# Patient Record
Sex: Male | Born: 1950 | Race: White | Hispanic: No | Marital: Married | Smoking: Former smoker
Health system: Southern US, Community
[De-identification: ages and names within clinical notes are randomized; demographics above are authoritative.]

## PROBLEM LIST (undated history)

## (undated) DIAGNOSIS — E119 Type 2 diabetes mellitus without complications: Secondary | ICD-10-CM

## (undated) HISTORY — PX: BACK SURGERY: SHX140

---

## 1999-11-20 ENCOUNTER — Encounter: Payer: Self-pay | Admitting: Emergency Medicine

## 1999-11-20 ENCOUNTER — Emergency Department (HOSPITAL_COMMUNITY): Admission: EM | Admit: 1999-11-20 | Discharge: 1999-11-20 | Payer: Self-pay | Admitting: Emergency Medicine

## 2007-02-21 ENCOUNTER — Encounter: Admission: RE | Admit: 2007-02-21 | Discharge: 2007-02-21 | Payer: Self-pay | Admitting: Neurosurgery

## 2007-03-22 ENCOUNTER — Inpatient Hospital Stay (HOSPITAL_COMMUNITY): Admission: RE | Admit: 2007-03-22 | Discharge: 2007-03-23 | Payer: Self-pay | Admitting: Neurosurgery

## 2008-11-02 IMAGING — RF DG MYELOGRAM CERVICAL
11 series · 11 of 11 positions shown · IV contrast (omnipaque)
Comparison: none

CLINICAL DATA: Patient has neck pain extending down the left arm and fingers with numbness.  He was referred for as cervical myelogram:
 CERVICAL MYELOGRAM:
 Lumbar puncture was performed at the L3 level.  10cc of Omnipaque 300 contrast was injected.  The needle was withdrawn and contrast was subsequently dumped into the cervical region. 
 There is an extradural defect on the left at the C6-7 level.  Truncation of the left C7 root sleeve.  In the lateral projection, ventral defects are seen at C3-4, C4-5.
TECHNIQUE: Multidetector CT imaging of the cervical spine was performed after intrathecal injection of contrast.  Multiplanar CT image reconstructions were also generated.  
 In the sagittal projection the alignment is anatomic.  Uncinate spurring is seen at C3-4 and C4-5.  No appreciable osseous foraminal encroachment.  
 Axial Imaging:  
 C2-3:  Mild uncinate spurring. 
 C3-4:  Uncinate spurring with mild facet degenerative change.  
 C4-5:  Uncinate spurring with disc bulge.  Facet degenerative change.  No appreciable central nor foraminal stenosis. 
 C5-6:  Uncinate spurring with facet hypertrophy.  Mild narrowing of the left C5-6 foramen. 
 C6-7:  Left foraminal disc extrusion.  Disc material results in truncation of the exiting nerve root.  There is questionable disc material also extending into the right foramen without definite encroachment on the nerve root.  Facet degenerative changes bilaterally.  No central stenosis. 
 C7-T1:  Normal interspace.

[Series 1: (hospital) · 1 of 1 slices shown (1 of 2)]
[im 1/1]
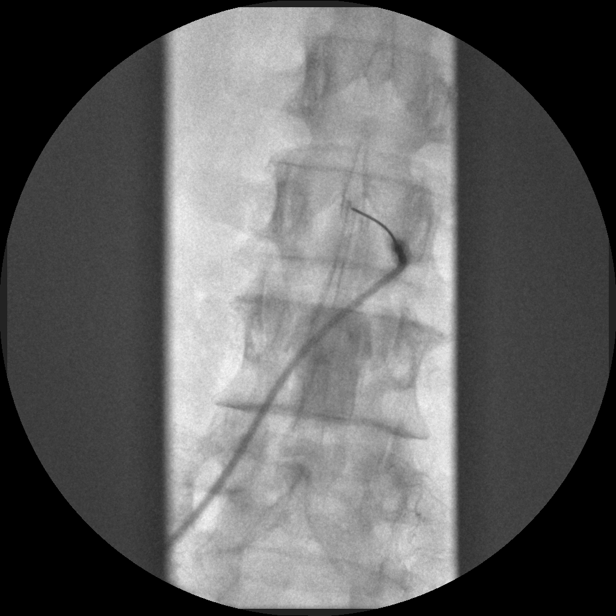

[Series 2: (hospital) · 1 of 1 slices shown (2 of 2)]
[im 1/1]
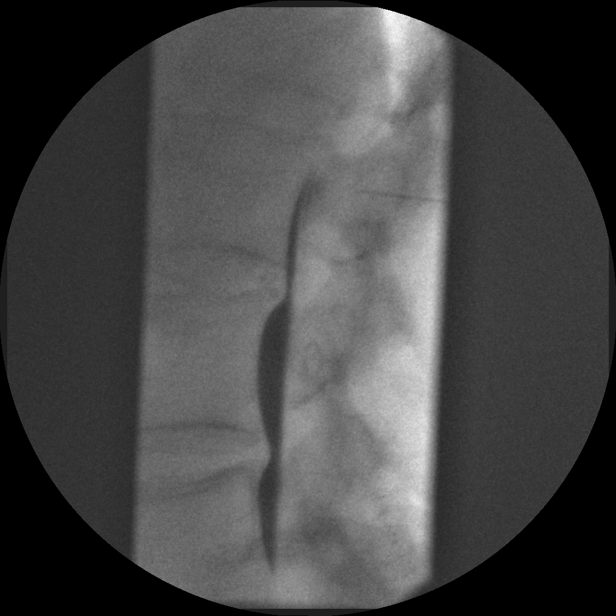

[Series 3: myelogram  white · 1 of 1 slices shown (1 of 9)]
[im 1/1]
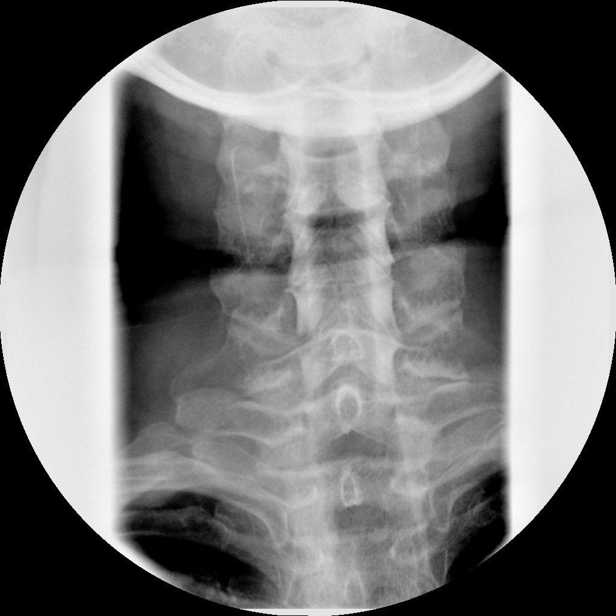

[Series 4: myelogram  white · 1 of 1 slices shown (2 of 9)]
[im 1/1]
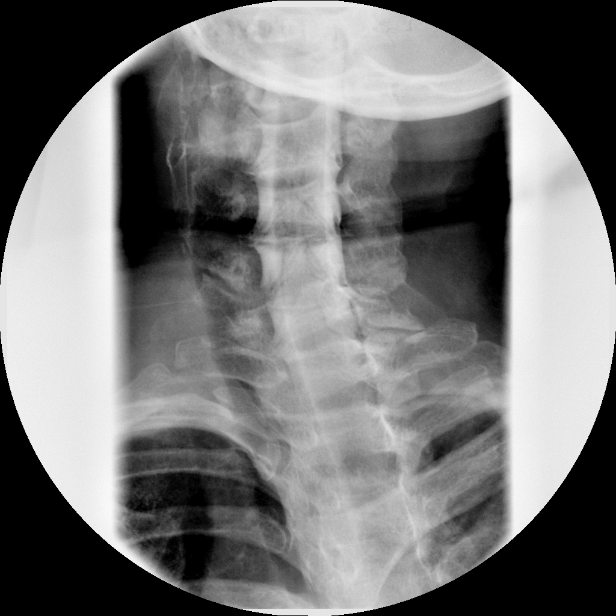

[Series 5: myelogram  white · 1 of 1 slices shown (3 of 9)]
[im 1/1]
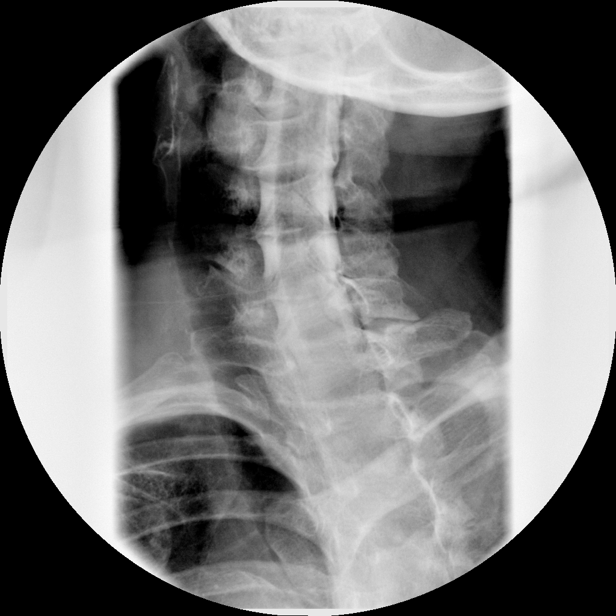

[Series 6: myelogram  white · 1 of 1 slices shown (4 of 9)]
[im 1/1]
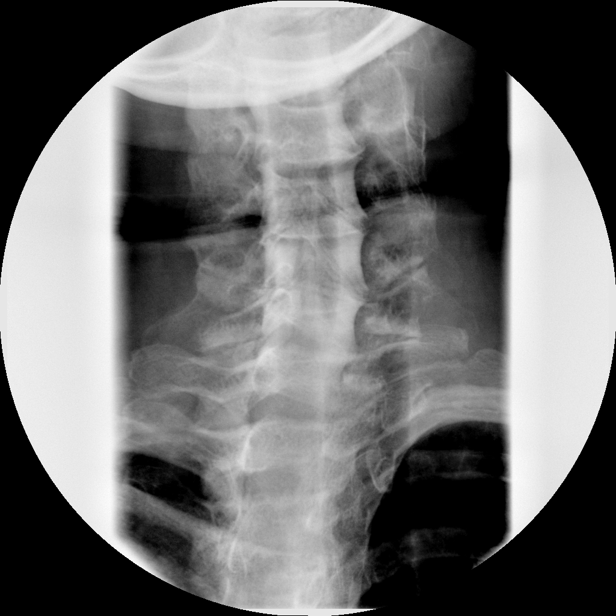

[Series 7: myelogram  white · 1 of 1 slices shown (5 of 9)]
[im 1/1]
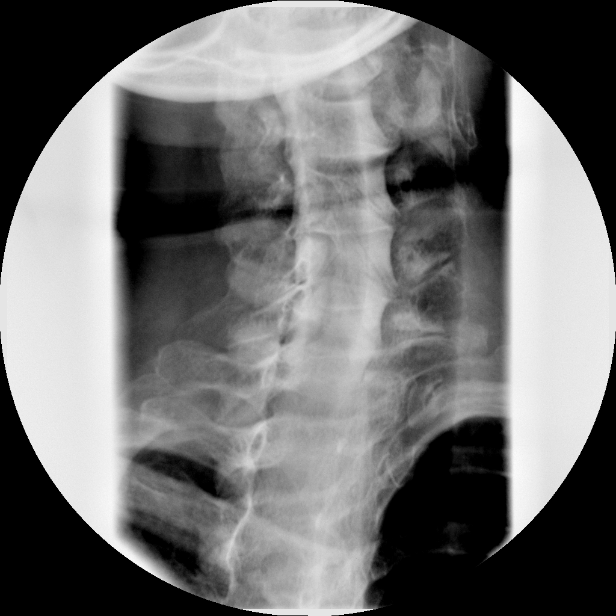

[Series 8: myelogram  white · 1 of 1 slices shown (6 of 9)]
[im 1/1]
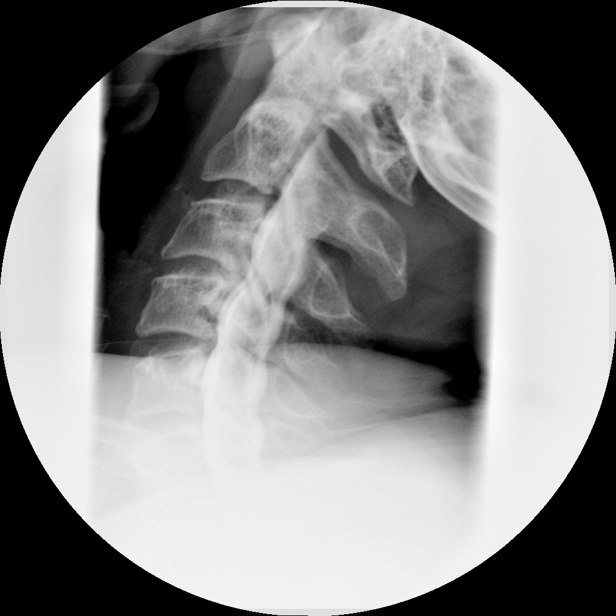

[Series 9: myelogram  white · 1 of 1 slices shown (7 of 9)]
[im 1/1]
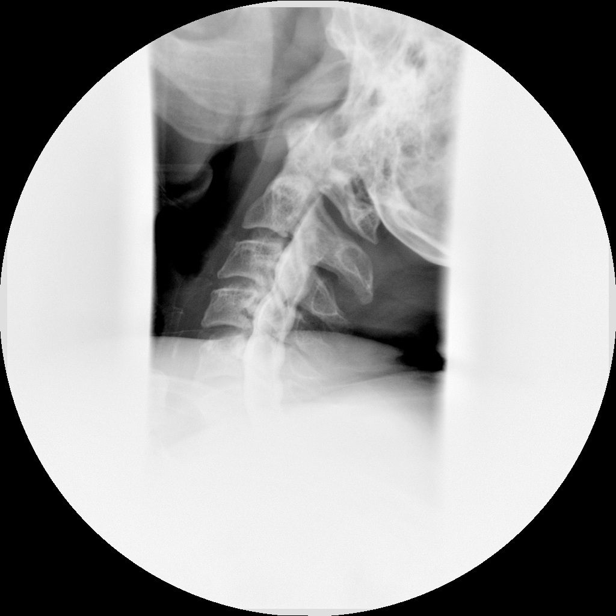

[Series 10: myelogram  white · 1 of 1 slices shown (8 of 9)]
[im 1/1]
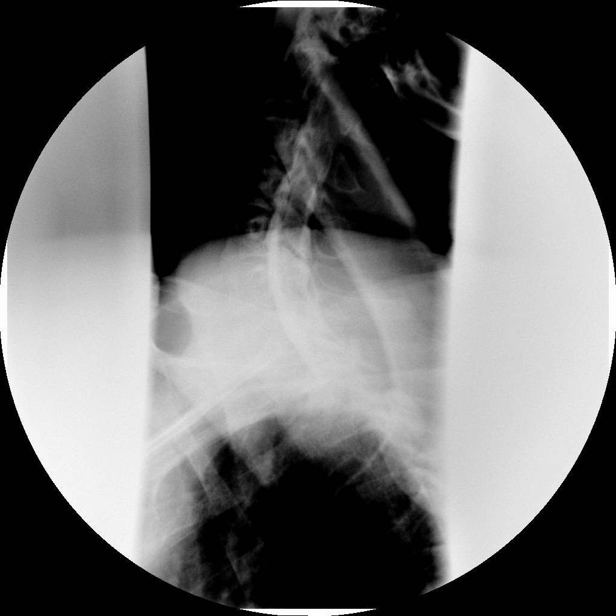

[Series 11: myelogram  white · 1 of 1 slices shown (9 of 9)]
[im 1/1]
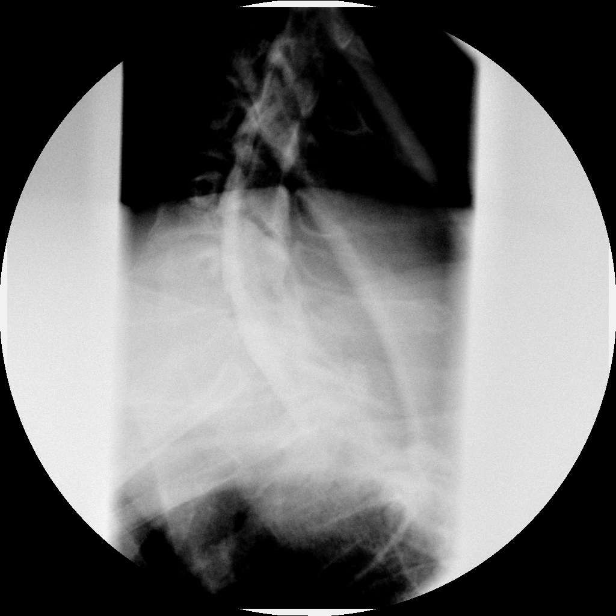

[11 of 11 positions shown; findings below may reference images not displayed]

IMPRESSION: Extradural defect left C6-7.  CT to follow. 
 CT CERVICAL SPINE W/CONTRAST (POST-MYELOGRAM):
IMPRESSION: 1.  C6-7:  Left foraminal disc extrusion.  This results in truncation of the exiting nerve root.  No osseous foraminal encroachment.  I question whether there is some disc material also extending into the right foramen.  No central stenosis. 
 2.  C5-6:  Uncinate spurring with facet hypertrophy result in mild left foraminal narrowing. 
 3.  Spondylosis C3-4 and C4-5 without stenosis.

## 2009-06-24 DIAGNOSIS — E785 Hyperlipidemia, unspecified: Secondary | ICD-10-CM | POA: Diagnosis present

## 2009-06-24 DIAGNOSIS — I10 Essential (primary) hypertension: Secondary | ICD-10-CM | POA: Diagnosis present

## 2009-11-04 DIAGNOSIS — K219 Gastro-esophageal reflux disease without esophagitis: Secondary | ICD-10-CM | POA: Diagnosis present

## 2010-08-25 DIAGNOSIS — E1039 Type 1 diabetes mellitus with other diabetic ophthalmic complication: Secondary | ICD-10-CM | POA: Diagnosis present

## 2010-09-27 ENCOUNTER — Encounter: Payer: Self-pay | Admitting: Sports Medicine

## 2010-09-28 ENCOUNTER — Encounter: Payer: Self-pay | Admitting: Internal Medicine

## 2010-12-19 ENCOUNTER — Ambulatory Visit (HOSPITAL_COMMUNITY)
Admission: RE | Admit: 2010-12-19 | Discharge: 2010-12-19 | Disposition: A | Discharge status: Home / Self Care | Payer: Medicare Other | Source: Ambulatory Visit | Attending: Cardiology | Admitting: Cardiology

## 2010-12-19 DIAGNOSIS — R0989 Other specified symptoms and signs involving the circulatory and respiratory systems: Secondary | ICD-10-CM | POA: Insufficient documentation

## 2010-12-19 DIAGNOSIS — R0609 Other forms of dyspnea: Secondary | ICD-10-CM | POA: Insufficient documentation

## 2011-01-20 NOTE — Op Note (Signed)
NAMEAARON, BOSTWICK NO.:  [id number]   MEDICAL RECORD NO.:  [id number]          PATIENT TYPE:  INP   LOCATION:  3172                         FACILITY:  [facility]   PHYSICIAN:  Hilda Lias, M.D.   DATE OF BIRTH:  02-07-1951   DATE OF PROCEDURE:  03/22/2007  DATE OF DISCHARGE:                               OPERATIVE REPORT   PREOPERATIVE DIAGNOSES:  C6-C7 herniated disk with C7 radiculopathy.   POSTOPERATIVE DIAGNOSIS:  C6-C7 herniated disk with C7 radiculopathy.   PROCEDURE:  Anterior C6-7 diskectomy, decompression of spinal cord,  bilateral foraminotomy, interbody fusion with allograft plate,  microscope.   SURGEON:  Hilda Lias, M.D.   ASSISTANT:  Coletta Memos, M.D.   CLINICAL HISTORY:  The patient complained of neck and burning pain and  numbness in the left upper extremity.  Myelogram showed a herniated disk  at the level of C6-7 to the left.  Surgery was advised.   PROCEDURE:  The patient was taken to the OR.  After intubation the left  side of neck was prepped with DuraPrep.  Transverse incision was made  through the skin, platysma down to the cervical spine. X-rays showed  that the first we were at C4-5 and second at the level C5-6.  From then  on we opened the anterior ligament at the level of C6-7.  There was some  calcification.  That area was quite lordotic and there was poorly  flexible.  Nevertheless, with the microscope we did a total gross  diskectomy. Posterior ligament was opened and we did a foraminotomy  first to the right side and then to the left side.  The left side we  found that the piece of fragment completely adherent to the C7 nerve  root which was swollen and reddish.  Good decompression was achieved.  From then on, after we did a good central decompression of the spinal  cord, the endplates were drilled and allograft of 7 mm was inserted  followed by a plate using four screws. Lateral cervical spine showed  that the upper  part of the plate was in good position.  From then on the  area was irrigated.  Hemostasis was accomplished with bipolar.  We  waited 10 minutes before we were 100% sure there was not any bleeding.  From then on the area was closed with Vicryl and Steri-Strips.           ______________________________  Hilda Lias, M.D.     EB/MEDQ  D:  03/22/2007  T:  03/23/2007  Job:  [id number]

## 2011-01-20 NOTE — H&P (Signed)
NAME:  Oscar Moses, Oscar Moses NO.:  [id number]   MEDICAL RECORD NO.:  [id number]          PATIENT TYPE:  INP   LOCATION:  NA                           FACILITY:  [facility]   PHYSICIAN:  Hilda Lias, M.D.   DATE OF BIRTH:  April 30, 1951   DATE OF ADMISSION:  03/22/2007  DATE OF DISCHARGE:                              HISTORY & PHYSICAL   HISTORY:  Mr. Oscar Moses is a gentleman who was seen initially by me in my  office on February 16, 2007 complaining of neck pain, radiation to the left  upper extremity up to the point when he tilts his head backward he  developed pain and numbness of the left arm.  It is getting difficult  for him to continue working.  When he holds his neck stiff, the numbness  goes away.  Patient had an MRI that showed spondylosis at the level of 3-  4, 4-5, 5-6, and because of that we went ahead with an outpatient  myelogram.   PAST MEDICAL HISTORY:  For back surgery by Dr. Eulah Pont.  He has a spinal  cord stimulator.   SOCIAL HISTORY:  Patient has smoked pack a day.   FAMILY HISTORY:  Unremarkable.   REVIEW OF SYSTEMS:  Positive for high blood pressure, back pain, neck  pain, prior weakness.   PHYSICAL EXAMINATION:  HEAD:  Normocephalic.  NECK:  He has some decreased flexibility of lumbar spine.  LUNGS:  Clear.  HEART SOUND:  Normal.  NEURO:  Shows that the strength is  completely normal.  He has some mild  weakness in the muscle, left worse than the right one, reflexes 1+,  sensation normal, although he can feel a tingling sensation in the left  upper extremity.   The myelogram showed that he has a large herniated disk of at the level  of C6-7, to the leg, with some question of going to the right side.   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   RECOMMENDATIONS:  The patient is going to be admitted for surgery.  The  procedure will be C6-7 cervical decompression with fusion.  Explained  some of the risks, including CSF  leak, worsening pain, need for further  surgery and stroke.           ______________________________  Hilda Lias, M.D.     EB/MEDQ  D:  03/22/2007  T:  03/22/2007  Job:  [id number]

## 2011-06-22 LAB — GLUCOSE, RANDOM: Glucose, Bld: 491 — ABNORMAL HIGH

## 2011-06-23 LAB — CBC
HCT: 37.4 — ABNORMAL LOW
MCHC: 34.3
MCV: 99.9
Platelets: 287
RDW: 12.4
WBC: 10.1

## 2011-06-23 LAB — BASIC METABOLIC PANEL
BUN: 11
CO2: 28
Chloride: 101
Creatinine, Ser: 0.79
Glucose, Bld: 244 — ABNORMAL HIGH
Potassium: 4.6

## 2012-10-26 ENCOUNTER — Ambulatory Visit (INDEPENDENT_AMBULATORY_CARE_PROVIDER_SITE_OTHER): Payer: Medicare Other | Admitting: Ophthalmology

## 2012-10-26 DIAGNOSIS — I10 Essential (primary) hypertension: Secondary | ICD-10-CM

## 2012-10-26 DIAGNOSIS — E1139 Type 2 diabetes mellitus with other diabetic ophthalmic complication: Secondary | ICD-10-CM

## 2012-10-26 DIAGNOSIS — H43819 Vitreous degeneration, unspecified eye: Secondary | ICD-10-CM

## 2012-10-26 DIAGNOSIS — H35039 Hypertensive retinopathy, unspecified eye: Secondary | ICD-10-CM

## 2012-10-26 DIAGNOSIS — E11319 Type 2 diabetes mellitus with unspecified diabetic retinopathy without macular edema: Secondary | ICD-10-CM

## 2012-10-26 DIAGNOSIS — E1165 Type 2 diabetes mellitus with hyperglycemia: Secondary | ICD-10-CM

## 2013-04-25 ENCOUNTER — Ambulatory Visit (INDEPENDENT_AMBULATORY_CARE_PROVIDER_SITE_OTHER): Payer: Medicare Other | Admitting: Ophthalmology

## 2013-07-20 DIAGNOSIS — N182 Chronic kidney disease, stage 2 (mild): Secondary | ICD-10-CM | POA: Diagnosis present

## 2013-10-18 ENCOUNTER — Encounter (INDEPENDENT_AMBULATORY_CARE_PROVIDER_SITE_OTHER): Payer: Medicare Other | Admitting: Ophthalmology

## 2013-10-18 DIAGNOSIS — E1139 Type 2 diabetes mellitus with other diabetic ophthalmic complication: Secondary | ICD-10-CM

## 2013-10-18 DIAGNOSIS — E1165 Type 2 diabetes mellitus with hyperglycemia: Secondary | ICD-10-CM

## 2013-10-18 DIAGNOSIS — H3581 Retinal edema: Secondary | ICD-10-CM

## 2013-10-18 DIAGNOSIS — I10 Essential (primary) hypertension: Secondary | ICD-10-CM

## 2013-10-18 DIAGNOSIS — H35039 Hypertensive retinopathy, unspecified eye: Secondary | ICD-10-CM

## 2013-10-18 DIAGNOSIS — E11319 Type 2 diabetes mellitus with unspecified diabetic retinopathy without macular edema: Secondary | ICD-10-CM

## 2013-10-18 DIAGNOSIS — H3530 Unspecified macular degeneration: Secondary | ICD-10-CM

## 2013-10-18 DIAGNOSIS — H43819 Vitreous degeneration, unspecified eye: Secondary | ICD-10-CM

## 2013-10-25 ENCOUNTER — Other Ambulatory Visit (INDEPENDENT_AMBULATORY_CARE_PROVIDER_SITE_OTHER): Payer: Medicare Other | Admitting: Ophthalmology

## 2013-11-02 ENCOUNTER — Other Ambulatory Visit (INDEPENDENT_AMBULATORY_CARE_PROVIDER_SITE_OTHER): Payer: Medicare Other | Admitting: Ophthalmology

## 2013-11-09 ENCOUNTER — Other Ambulatory Visit (INDEPENDENT_AMBULATORY_CARE_PROVIDER_SITE_OTHER): Payer: Medicare Other | Admitting: Ophthalmology

## 2013-11-09 DIAGNOSIS — H3581 Retinal edema: Secondary | ICD-10-CM

## 2014-03-15 ENCOUNTER — Ambulatory Visit (INDEPENDENT_AMBULATORY_CARE_PROVIDER_SITE_OTHER): Payer: Medicare Other | Admitting: Ophthalmology

## 2015-07-29 DIAGNOSIS — K900 Celiac disease: Secondary | ICD-10-CM | POA: Diagnosis present

## 2015-09-17 ENCOUNTER — Ambulatory Visit: Payer: Self-pay | Admitting: Dietician

## 2016-03-16 DIAGNOSIS — J449 Chronic obstructive pulmonary disease, unspecified: Secondary | ICD-10-CM | POA: Diagnosis present

## 2017-03-12 DIAGNOSIS — D649 Anemia, unspecified: Secondary | ICD-10-CM | POA: Diagnosis present

## 2017-03-19 DIAGNOSIS — F325 Major depressive disorder, single episode, in full remission: Secondary | ICD-10-CM | POA: Diagnosis present

## 2018-10-20 DIAGNOSIS — K5790 Diverticulosis of intestine, part unspecified, without perforation or abscess without bleeding: Secondary | ICD-10-CM | POA: Insufficient documentation

## 2019-09-06 ENCOUNTER — Ambulatory Visit: Payer: Medicare Other | Attending: Internal Medicine

## 2019-09-06 DIAGNOSIS — Z20822 Contact with and (suspected) exposure to covid-19: Secondary | ICD-10-CM

## 2019-09-07 LAB — NOVEL CORONAVIRUS, NAA: SARS-CoV-2, NAA: DETECTED — AB

## 2019-09-11 ENCOUNTER — Telehealth (HOSPITAL_COMMUNITY): Payer: Self-pay | Admitting: Critical Care Medicine

## 2019-09-11 ENCOUNTER — Other Ambulatory Visit (HOSPITAL_COMMUNITY): Payer: Self-pay | Admitting: Critical Care Medicine

## 2019-09-11 DIAGNOSIS — I10 Essential (primary) hypertension: Secondary | ICD-10-CM

## 2019-09-11 DIAGNOSIS — N1831 Chronic kidney disease, stage 3a: Secondary | ICD-10-CM

## 2019-09-11 DIAGNOSIS — U071 COVID-19: Secondary | ICD-10-CM

## 2019-09-11 DIAGNOSIS — E1065 Type 1 diabetes mellitus with hyperglycemia: Secondary | ICD-10-CM

## 2019-09-11 NOTE — Progress Notes (Signed)
  I connected by phone with Oscar Moses on 09/11/2019 at 5:31 PM to discuss the potential use of an new treatment for mild to moderate COVID-19 viral infection in non-hospitalized patients.  This patient is a 69 y.o. male that meets the FDA criteria for Emergency Use Authorization of bamlanivimab or casirivimab\imdevimab.  Has a (+) direct SARS-CoV-2 viral test result  Has mild or moderate COVID-19   Is ? 69 years of age and weighs ? 40 kg  Is NOT hospitalized due to COVID-19  Is NOT requiring oxygen therapy or requiring an increase in baseline oxygen flow rate due to COVID-19  Is within 10 days of symptom onset  Has at least one of the high risk factor(s) for progression to severe COVID-19 and/or hospitalization as defined in EUA.  Specific high risk criteria : Chronic Kidney Disease (CKD), age > 56, HTN, T1DM   I have spoken and communicated the following to the patient or parent/caregiver:  1. FDA has authorized the emergency use of bamlanivimab and casirivimab\imdevimab for the treatment of mild to moderate COVID-19 in adults and pediatric patients with positive results of direct SARS-CoV-2 viral testing who are 38 years of age and older weighing at least 40 kg, and who are at high risk for progressing to severe COVID-19 and/or hospitalization.  2. The significant known and potential risks and benefits of bamlanivimab and casirivimab\imdevimab, and the extent to which such potential risks and benefits are unknown.  3. Information on available alternative treatments and the risks and benefits of those alternatives, including clinical trials.  4. Patients treated with bamlanivimab and casirivimab\imdevimab should continue to self-isolate and use infection control measures (e.g., wear mask, isolate, social distance, avoid sharing personal items, clean and disinfect "high touch" surfaces, and frequent handwashing) according to CDC guidelines.   5. The patient or parent/caregiver has the  option to accept or refuse bamlanivimab or casirivimab\imdevimab .  After reviewing this information with the patient, The patient agreed to proceed with receiving the bamlanimivab infusion and will be provided a copy of the Fact sheet prior to receiving the infusion.Oscar Moses 09/11/2019 5:31 PM

## 2019-09-11 NOTE — Telephone Encounter (Signed)
I connected with this patient who is Covid positive and is high risk due to type 1 diabetes hypertension chronic kidney disease and age.  He has been symptomatic since December 28.  He is getting worse progressively.  He has had loss of appetite body aches weakness mental status changes.  He has a candidate for monoclonal antibody infusion.  IV established January 7 as is treatment date which will be right at 10 days     Called to discuss with patient about Covid symptoms and the use of bamlanivimab, a monoclonal antibody infusion for those with mild to moderate Covid symptoms and at a high risk of hospitalization.  Pt is qualified for this infusion at the [facility] infusion center due to Age > 65, Diabetes, Hypertension and Chronic kidney disease

## 2019-09-14 ENCOUNTER — Encounter (HOSPITAL_COMMUNITY): Payer: Self-pay

## 2019-09-14 ENCOUNTER — Ambulatory Visit (HOSPITAL_COMMUNITY)
Admission: RE | Admit: 2019-09-14 | Discharge: 2019-09-14 | Disposition: A | Discharge status: Home / Self Care | Payer: Medicare Other | Source: Ambulatory Visit | Attending: Pulmonary Disease | Admitting: Pulmonary Disease

## 2019-09-14 DIAGNOSIS — N1831 Chronic kidney disease, stage 3a: Secondary | ICD-10-CM | POA: Insufficient documentation

## 2019-09-14 DIAGNOSIS — Z23 Encounter for immunization: Secondary | ICD-10-CM | POA: Insufficient documentation

## 2019-09-14 DIAGNOSIS — E1065 Type 1 diabetes mellitus with hyperglycemia: Secondary | ICD-10-CM

## 2019-09-14 DIAGNOSIS — U071 COVID-19: Secondary | ICD-10-CM | POA: Insufficient documentation

## 2019-09-14 DIAGNOSIS — I10 Essential (primary) hypertension: Secondary | ICD-10-CM | POA: Diagnosis present

## 2019-09-14 MED ORDER — METHYLPREDNISOLONE SODIUM SUCC 125 MG IJ SOLR: 125.0000 mg | Freq: Once | INTRAMUSCULAR | Status: DC | PRN

## 2019-09-14 MED ORDER — FAMOTIDINE IN NACL 20-0.9 MG/50ML-% IV SOLN: 20.0000 mg | Freq: Once | INTRAVENOUS | Status: DC | PRN

## 2019-09-14 MED ORDER — SODIUM CHLORIDE 0.9 % IV SOLN
INTRAVENOUS | Status: DC | PRN
  Administered 2019-09-14: 15:00:00 250 mL via INTRAVENOUS

## 2019-09-14 MED ORDER — EPINEPHRINE 0.3 MG/0.3ML IJ SOAJ: 0.3000 mg | Freq: Once | INTRAMUSCULAR | Status: DC | PRN

## 2019-09-14 MED ORDER — SODIUM CHLORIDE 0.9 % IV SOLN
700.0000 mg | Freq: Once | INTRAVENOUS | Status: AC
  Administered 2019-09-14: 15:00:00 700 mg via INTRAVENOUS
  Filled 2019-09-14: qty 20

## 2019-09-14 MED ORDER — DIPHENHYDRAMINE HCL 50 MG/ML IJ SOLN: 50.0000 mg | Freq: Once | INTRAMUSCULAR | Status: DC | PRN

## 2019-09-14 MED ORDER — ALBUTEROL SULFATE HFA 108 (90 BASE) MCG/ACT IN AERS: 2.0000 | INHALATION_SPRAY | Freq: Once | RESPIRATORY_TRACT | Status: DC | PRN

## 2019-09-14 NOTE — Discharge Instructions (Signed)

## 2019-09-14 NOTE — Progress Notes (Signed)
  Diagnosis: COVID-19  Physician: Dr. Delford Field  Procedure: Covid Infusion Clinic Med: bamlanivimab infusion - Provided patient with bamlanimivab fact sheet for patients, parents and caregivers prior to infusion.  Complications: No immediate complications noted.  Discharge: Discharged home   Tonna Boehringer 09/14/2019

## 2019-10-09 DIAGNOSIS — Z8616 Personal history of COVID-19: Secondary | ICD-10-CM | POA: Insufficient documentation

## 2019-10-09 DIAGNOSIS — G479 Sleep disorder, unspecified: Secondary | ICD-10-CM | POA: Insufficient documentation

## 2019-11-09 ENCOUNTER — Ambulatory Visit: Payer: Medicare Other

## 2019-11-12 ENCOUNTER — Ambulatory Visit: Payer: Medicare Other | Attending: Internal Medicine

## 2019-11-12 DIAGNOSIS — Z23 Encounter for immunization: Secondary | ICD-10-CM

## 2019-11-12 NOTE — Progress Notes (Signed)
   Covid-19 Vaccination Clinic  Name:  Oscar Moses    MRN: [id number] DOB: 09-27-1950  11/12/2019  Mr. [name] was observed post Covid-19 immunization for 15 minutes without incident. He was provided with Vaccine Information Sheet and instruction to access the V-Safe system.   Mr. [name] was instructed to call 911 with any severe reactions post vaccine: Marland Kitchen Difficulty breathing  . Swelling of face and throat  . A fast heartbeat  . A bad rash all over body  . Dizziness and weakness   Immunizations Administered    Name Date Dose VIS Date Route   Pfizer COVID-19 Vaccine 11/12/2019  1:08 PM 0.3 mL 08/18/2019 Intramuscular   Manufacturer: [employer], [employer]   Lot: [id number]   NDC: [address]-7

## 2019-12-12 ENCOUNTER — Ambulatory Visit: Payer: Medicare Other | Attending: Internal Medicine

## 2019-12-12 DIAGNOSIS — Z23 Encounter for immunization: Secondary | ICD-10-CM

## 2019-12-12 NOTE — Progress Notes (Signed)
   Covid-19 Vaccination Clinic  Name:  Oscar Moses    MRN: [id number] DOB: 09-02-1951  12/12/2019  Oscar Moses was observed post Covid-19 immunization for 15 minutes without incident. He was provided with Vaccine Information Sheet and instruction to access the V-Safe system.   Oscar Moses was instructed to call 911 with any severe reactions post vaccine: Marland Kitchen Difficulty breathing  . Swelling of face and throat  . A fast heartbeat  . A bad rash all over body  . Dizziness and weakness   Immunizations Administered    Name Date Dose VIS Date Route   Pfizer COVID-19 Vaccine 12/12/2019 12:19 PM 0.3 mL 08/18/2019 Intramuscular   Manufacturer: [employer], [employer]   Lot: [id number]   NDC: [address]-3

## 2022-03-30 DIAGNOSIS — M545 Low back pain, unspecified: Secondary | ICD-10-CM | POA: Insufficient documentation

## 2023-01-01 ENCOUNTER — Encounter (HOSPITAL_BASED_OUTPATIENT_CLINIC_OR_DEPARTMENT_OTHER): Payer: Self-pay | Admitting: Emergency Medicine

## 2023-01-01 ENCOUNTER — Emergency Department (HOSPITAL_BASED_OUTPATIENT_CLINIC_OR_DEPARTMENT_OTHER)
Admission: EM | Admit: 2023-01-01 | Discharge: 2023-01-01 | Disposition: A | Discharge status: Home / Self Care | Payer: Medicare Other | Attending: Emergency Medicine | Admitting: Emergency Medicine

## 2023-01-01 ENCOUNTER — Other Ambulatory Visit: Payer: Self-pay

## 2023-01-01 ENCOUNTER — Emergency Department (HOSPITAL_BASED_OUTPATIENT_CLINIC_OR_DEPARTMENT_OTHER): Payer: Medicare Other

## 2023-01-01 DIAGNOSIS — E1065 Type 1 diabetes mellitus with hyperglycemia: Secondary | ICD-10-CM | POA: Insufficient documentation

## 2023-01-01 DIAGNOSIS — Z1152 Encounter for screening for COVID-19: Secondary | ICD-10-CM | POA: Diagnosis not present

## 2023-01-01 DIAGNOSIS — R531 Weakness: Secondary | ICD-10-CM | POA: Diagnosis present

## 2023-01-01 HISTORY — DX: Type 2 diabetes mellitus without complications: E11.9

## 2023-01-01 LAB — BASIC METABOLIC PANEL
Anion gap: 15 (ref 5–15)
BUN: 43 mg/dL — ABNORMAL HIGH (ref 8–23)
CO2: 23 mmol/L (ref 22–32)
Calcium: 9.7 mg/dL (ref 8.9–10.3)
Chloride: 100 mmol/L (ref 98–111)
Creatinine, Ser: 1.88 mg/dL — ABNORMAL HIGH (ref 0.61–1.24)
GFR, Estimated: 38 mL/min — ABNORMAL LOW (ref >60)
Glucose, Bld: 159 mg/dL — ABNORMAL HIGH (ref 70–99)
Potassium: 4.2 mmol/L (ref 3.5–5.1)
Sodium: 138 mmol/L (ref 135–145)

## 2023-01-01 LAB — RESP PANEL BY RT-PCR (RSV, FLU A&B, COVID)  RVPGX2
Influenza A by PCR: NEGATIVE
Influenza B by PCR: NEGATIVE
Resp Syncytial Virus by PCR: NEGATIVE
SARS Coronavirus 2 by RT PCR: NEGATIVE

## 2023-01-01 LAB — URINALYSIS, ROUTINE W REFLEX MICROSCOPIC
Bacteria, UA: NONE SEEN
Bilirubin Urine: NEGATIVE
Glucose, UA: 500 mg/dL — AB
Hgb urine dipstick: NEGATIVE
Ketones, ur: NEGATIVE mg/dL
Leukocytes,Ua: NEGATIVE
Nitrite: NEGATIVE
Protein, ur: 100 mg/dL — AB
Specific Gravity, Urine: 1.02 (ref 1.005–1.030)
pH: 5.5 (ref 5.0–8.0)

## 2023-01-01 LAB — CBC
HCT: 37.7 % — ABNORMAL LOW (ref 39.0–52.0)
Hemoglobin: 13.2 g/dL (ref 13.0–17.0)
MCH: 32.8 pg (ref 26.0–34.0)
MCHC: 35 g/dL (ref 30.0–36.0)
MCV: 93.5 fL (ref 80.0–100.0)
Platelets: 435 10*3/uL — ABNORMAL HIGH (ref 150–400)
RBC: 4.03 MIL/uL — ABNORMAL LOW (ref 4.22–5.81)
RDW: 11.8 % (ref 11.5–15.5)
WBC: 6.9 10*3/uL (ref 4.0–10.5)
nRBC: 0 % (ref 0.0–0.2)

## 2023-01-01 LAB — CBG MONITORING, ED: Glucose-Capillary: 152 mg/dL — ABNORMAL HIGH (ref 70–99)

## 2023-01-01 MED ORDER — LACTATED RINGERS IV BOLUS
1000.0000 mL | Freq: Once | INTRAVENOUS | Status: AC
  Administered 2023-01-01: 1000 mL via INTRAVENOUS

## 2023-01-01 NOTE — ED Notes (Addendum)
Collected 1 set of blood cultures and a lactic

## 2023-01-01 NOTE — ED Triage Notes (Signed)
Pt arrives pov, slow gait to triage, c/o weakness and fatigue for "several months".  Pt denies CP, denies shob. Pt aox4, bilaterally equal.

## 2023-01-01 NOTE — Discharge Instructions (Addendum)
You came to the emergency department today due to weakness.  After being given fluids and food to bring up your blood sugar you looked and felt much better.  You did not want to stay in the hospital however I do want you to follow-up with your primary care early next week.  They can recheck your lab work to assess your kidneys.  Also if you can, please make an appointment with your endocrinologist.  If you are not able to meet with them directly your primary care can also help manage your diabetes.  As we discussed, increase your water intake but avoid sugary drinks such as Gatorade.  There are multiple electrolyte powders that you may place in water that you can buy over-the-counter.  Do not hesitate to return to the nearest emergency department with any worsening symptoms.

## 2023-01-01 NOTE — ED Provider Notes (Signed)
[place] EMERGENCY DEPARTMENT AT [facility] Provider Note   CSN: [id number] Arrival date & time: 01/01/23  1529     History  Chief Complaint  Patient presents with   Weakness    Oscar Moses is a 72 y.o. male with a past medical history of poorly controlled type 1 diabetes presenting today with weakness.  He and his family report that over the past 4 months he has been increasingly weak, fatigued and "just not feeling well."  He tells me that over the past 4 days he has started to have some lower extremity cramping and chills.  His wife says that all he keeps saying is "I do not feel well."  Denies any slurred speech, facial droop, unilateral weakness or numbness.  Describes his weakness and fatigue is global.  Reports that he checks his sugar every day via Dexcom device and that his sugars are variable.  Follows with an outpatient clinic for his diabetes and he believes his last A1c was around 9.   Weakness Associated symptoms: no dizziness and no fever        Home Medications Prior to Admission medications   Not on File      Allergies    Patient has no known allergies.    Review of Systems   Review of Systems  Constitutional:  Negative for chills and fever.  Skin:  Positive for pallor.  Neurological:  Positive for weakness. Negative for dizziness and light-headedness.    Physical Exam Updated Vital Signs BP (!) 122/92 (BP Location: Right Arm)   Pulse (!) 120   Temp 98.2 F (36.8 C) (Oral)   Resp 18   Ht 5\' 10"  (1.778 m)   Wt 74.8 kg   SpO2 99%   BMI 23.68 kg/m  Physical Exam Vitals and nursing note reviewed.  Constitutional:      General: He is not in acute distress.    Appearance: Normal appearance. He is not ill-appearing.  HENT:     Head: Normocephalic and atraumatic.  Eyes:     General: No scleral icterus.    Conjunctiva/sclera: Conjunctivae normal.  Cardiovascular:     Rate and Rhythm: Normal rate and regular rhythm.  Pulmonary:      Effort: Pulmonary effort is normal. No respiratory distress.     Breath sounds: No wheezing or rales.  Musculoskeletal:     Right lower leg: No edema.     Left lower leg: No edema.  Skin:    General: Skin is warm and dry.     Findings: No rash.  Neurological:     Mental Status: He is alert.     Comments: Cranial nerves II through XII grossly intact.  Global weakness on physical exam.  No facial droop or aphasia.  No dysmetria.  Alert and oriented  Psychiatric:        Mood and Affect: Mood normal.     ED Results / Procedures / Treatments   Labs (all labs ordered are listed, but only abnormal results are displayed) Labs Reviewed  BASIC METABOLIC PANEL - Abnormal; Notable for the following components:      Result Value   Glucose, Bld 159 (*)    BUN 43 (*)    Creatinine, Ser 1.88 (*)    GFR, Estimated 38 (*)    All other components within normal limits  CBC - Abnormal; Notable for the following components:   RBC 4.03 (*)    HCT 37.7 (*)    Platelets  435 (*)    All other components within normal limits  CBG MONITORING, ED - Abnormal; Notable for the following components:   Glucose-Capillary 152 (*)    All other components within normal limits  URINALYSIS, ROUTINE W REFLEX MICROSCOPIC    EKG EKG Interpretation  Date/Time:  Friday January 01 2023 15:43:22 EDT Ventricular Rate:  107 PR Interval:  188 QRS Duration: 76 QT Interval:  326 QTC Calculation: 435 R Axis:   44 Text Interpretation: Sinus tachycardia Cannot rule out Anterior infarct , age undetermined Abnormal ECG When compared with ECG of 16-Mar-2007 08:14, Vent. rate has increased BY  42 BPM Confirmed by Alona Bene [phone]) on 01/01/2023 4:04:25 PM  Radiology DG Chest Portable 1 View  Result Date: 01/01/2023 CLINICAL DATA:  Shortness of breath EXAM: PORTABLE CHEST 1 VIEW COMPARISON:  03/16/2007 FINDINGS: Cardiac size is within normal limits. There is poor inspiration. There are no signs of pulmonary edema or focal  pulmonary consolidation. Cardiac monitoring lead he is partly obscuring the medial left lower lung field. There is no pleural effusion or pneumothorax. There is surgical fusion in lower cervical spine. IMPRESSION: There are no signs of pulmonary edema or focal pulmonary consolidation. Electronically Signed   By: Ernie Avena M.D.   On: 01/01/2023 17:59    Procedures Procedures   Medications Ordered in ED Medications  lactated ringers bolus 1,000 mL (0 mLs Intravenous Stopped 01/01/23 1843)    ED Course/ Medical Decision Making/ A&P Clinical Course as of 01/01/23 1737  Fri Jan 01, 2023  1736 While at the bedside patient's Dexcom was reading 35, nursing staff made aware [MR]    Clinical Course User Index [MR] Kyndahl Jablon, Gabriel Cirri, PA-C                             Medical Decision Making Amount and/or Complexity of Data Reviewed Labs: ordered. Radiology: ordered.   72 year old male who presented today due to weakness.  Differential includes but is not limited to renal failure, CVA, electrolyte abnormality, ACS, hypoglycemia, hyperglycemia, hypotension.  This is not an exhaustive differential.    Past Medical History / Co-morbidities / Social History: Type 1 diabetes, poorly controlled   Physical Exam: Pertinent physical exam findings include Normal neuroexam Lung sounds clear, not tachycardic  Lab Tests: I ordered, and personally interpreted labs.  The pertinent results include: No electrolyte derangements Creatinine 1.8, BUN 43, GFR 38.  Unsure patient's baseline however they report no history of kidney disease Negative viral swab   Imaging Studies: Chest x-ray: Negative   Cardiac Monitoring:  The patient was maintained on a cardiac monitor.  I viewed and interpreted the cardiac monitored which showed an underlying rhythm of: Sinus   Medications: Fluid bolus.  Patient also was given juice and food due to his hypoglycemia.  On reassessment after both of these he  looked so much better than when he arrived  MDM/Disposition: This is a 72 year old male who presented today due to generalized weakness.  Has been going on for 4 months but acutely worse over the past 2 weeks.  Lab work notable of a creatinine of 1.88.  Unsure patient's baseline however he has never been told he has kidney disease and he follows closely with PCP.  Also has poorly controlled type 1 diabetes.  After reviewing his Dexcom meter it appears as though he ranges from 300s to 50s over the course of a couple hours.  Does not appear  to be related to what he eats and drinks per his historical report.  His weakness and overall fatigue and feeling or could be secondary to his varying blood sugars.  Patient did have an AKI however I doubt this has been going on for 4 months.  At this time I believe he needs to follow-up with PCP or endocrinology.  Of note patient and his family were offered admission for AKI and they said they would prefer to follow-up outpatient.  I suggested them also get in touch with his previous endocrinologist for tighter diabetes control.  They will also recheck his kidney function.  Patient, his wife and his son are all agreeable to the plan.    sion(s) / ED Diagnoses Final diagnoses:  Poorly controlled type 1 diabetes mellitus (HCC)    Rx / DC Orders ED Discharge Orders     None      Results and diagnoses were explained to the patient. Return precautions discussed in full. Patient had no additional questions and expressed complete understanding.   This chart was dictated using voice recognition software.  Despite best efforts to proofread,  errors can occur which can change the documentation meaning.    Woodroe Chen 01/01/23 Edd Arbour, MD 01/06/23 [phone]

## 2023-01-03 LAB — CBG MONITORING, ED: Glucose-Capillary: 95 mg/dL (ref 70–99)

## 2024-07-17 ENCOUNTER — Other Ambulatory Visit: Payer: Self-pay

## 2024-07-17 ENCOUNTER — Emergency Department (HOSPITAL_BASED_OUTPATIENT_CLINIC_OR_DEPARTMENT_OTHER)

## 2024-07-17 ENCOUNTER — Emergency Department (HOSPITAL_BASED_OUTPATIENT_CLINIC_OR_DEPARTMENT_OTHER): Admitting: Radiology

## 2024-07-17 ENCOUNTER — Encounter (HOSPITAL_BASED_OUTPATIENT_CLINIC_OR_DEPARTMENT_OTHER): Payer: Self-pay | Admitting: Emergency Medicine

## 2024-07-17 ENCOUNTER — Inpatient Hospital Stay (HOSPITAL_BASED_OUTPATIENT_CLINIC_OR_DEPARTMENT_OTHER)
Admission: EM | Admit: 2024-07-17 | Discharge: 2024-07-25 | DRG: 291 | Disposition: A | Discharge status: Home / Self Care | Source: Ambulatory Visit | Attending: Emergency Medicine | Admitting: Emergency Medicine

## 2024-07-17 DIAGNOSIS — I200 Unstable angina: Secondary | ICD-10-CM | POA: Diagnosis not present

## 2024-07-17 DIAGNOSIS — E1139 Type 2 diabetes mellitus with other diabetic ophthalmic complication: Secondary | ICD-10-CM | POA: Diagnosis present

## 2024-07-17 DIAGNOSIS — N182 Chronic kidney disease, stage 2 (mild): Secondary | ICD-10-CM | POA: Diagnosis present

## 2024-07-17 DIAGNOSIS — I493 Ventricular premature depolarization: Secondary | ICD-10-CM | POA: Diagnosis present

## 2024-07-17 DIAGNOSIS — Z79899 Other long term (current) drug therapy: Secondary | ICD-10-CM

## 2024-07-17 DIAGNOSIS — D649 Anemia, unspecified: Secondary | ICD-10-CM | POA: Diagnosis present

## 2024-07-17 DIAGNOSIS — D509 Iron deficiency anemia, unspecified: Secondary | ICD-10-CM | POA: Diagnosis present

## 2024-07-17 DIAGNOSIS — I214 Non-ST elevation (NSTEMI) myocardial infarction: Secondary | ICD-10-CM | POA: Diagnosis present

## 2024-07-17 DIAGNOSIS — K449 Diaphragmatic hernia without obstruction or gangrene: Secondary | ICD-10-CM | POA: Diagnosis present

## 2024-07-17 DIAGNOSIS — R0600 Dyspnea, unspecified: Secondary | ICD-10-CM

## 2024-07-17 DIAGNOSIS — I5033 Acute on chronic diastolic (congestive) heart failure: Secondary | ICD-10-CM | POA: Diagnosis present

## 2024-07-17 DIAGNOSIS — Z7901 Long term (current) use of anticoagulants: Secondary | ICD-10-CM

## 2024-07-17 DIAGNOSIS — I3139 Other pericardial effusion (noninflammatory): Secondary | ICD-10-CM | POA: Diagnosis present

## 2024-07-17 DIAGNOSIS — N179 Acute kidney failure, unspecified: Secondary | ICD-10-CM | POA: Diagnosis present

## 2024-07-17 DIAGNOSIS — I130 Hypertensive heart and chronic kidney disease with heart failure and stage 1 through stage 4 chronic kidney disease, or unspecified chronic kidney disease: Principal | ICD-10-CM | POA: Diagnosis present

## 2024-07-17 DIAGNOSIS — E1122 Type 2 diabetes mellitus with diabetic chronic kidney disease: Secondary | ICD-10-CM | POA: Diagnosis present

## 2024-07-17 DIAGNOSIS — K5730 Diverticulosis of large intestine without perforation or abscess without bleeding: Secondary | ICD-10-CM | POA: Diagnosis present

## 2024-07-17 DIAGNOSIS — K5521 Angiodysplasia of colon with hemorrhage: Secondary | ICD-10-CM | POA: Diagnosis present

## 2024-07-17 DIAGNOSIS — K3189 Other diseases of stomach and duodenum: Secondary | ICD-10-CM | POA: Diagnosis present

## 2024-07-17 DIAGNOSIS — E1165 Type 2 diabetes mellitus with hyperglycemia: Secondary | ICD-10-CM | POA: Diagnosis present

## 2024-07-17 DIAGNOSIS — R195 Other fecal abnormalities: Secondary | ICD-10-CM

## 2024-07-17 DIAGNOSIS — I480 Paroxysmal atrial fibrillation: Secondary | ICD-10-CM | POA: Diagnosis present

## 2024-07-17 DIAGNOSIS — R079 Chest pain, unspecified: Secondary | ICD-10-CM

## 2024-07-17 DIAGNOSIS — E11319 Type 2 diabetes mellitus with unspecified diabetic retinopathy without macular edema: Secondary | ICD-10-CM | POA: Diagnosis present

## 2024-07-17 DIAGNOSIS — G4700 Insomnia, unspecified: Secondary | ICD-10-CM | POA: Diagnosis present

## 2024-07-17 DIAGNOSIS — I25110 Atherosclerotic heart disease of native coronary artery with unstable angina pectoris: Secondary | ICD-10-CM | POA: Diagnosis present

## 2024-07-17 DIAGNOSIS — I161 Hypertensive emergency: Principal | ICD-10-CM | POA: Diagnosis present

## 2024-07-17 DIAGNOSIS — G8929 Other chronic pain: Secondary | ICD-10-CM | POA: Diagnosis present

## 2024-07-17 DIAGNOSIS — R0602 Shortness of breath: Secondary | ICD-10-CM

## 2024-07-17 DIAGNOSIS — Z87891 Personal history of nicotine dependence: Secondary | ICD-10-CM

## 2024-07-17 DIAGNOSIS — D123 Benign neoplasm of transverse colon: Secondary | ICD-10-CM | POA: Diagnosis present

## 2024-07-17 DIAGNOSIS — K8020 Calculus of gallbladder without cholecystitis without obstruction: Secondary | ICD-10-CM | POA: Diagnosis present

## 2024-07-17 DIAGNOSIS — K219 Gastro-esophageal reflux disease without esophagitis: Secondary | ICD-10-CM | POA: Diagnosis present

## 2024-07-17 DIAGNOSIS — K900 Celiac disease: Secondary | ICD-10-CM | POA: Diagnosis present

## 2024-07-17 DIAGNOSIS — K5520 Angiodysplasia of colon without hemorrhage: Secondary | ICD-10-CM

## 2024-07-17 DIAGNOSIS — E1039 Type 1 diabetes mellitus with other diabetic ophthalmic complication: Secondary | ICD-10-CM | POA: Diagnosis present

## 2024-07-17 DIAGNOSIS — J9601 Acute respiratory failure with hypoxia: Secondary | ICD-10-CM | POA: Diagnosis present

## 2024-07-17 DIAGNOSIS — E785 Hyperlipidemia, unspecified: Secondary | ICD-10-CM | POA: Diagnosis present

## 2024-07-17 DIAGNOSIS — R072 Precordial pain: Secondary | ICD-10-CM

## 2024-07-17 DIAGNOSIS — I10 Essential (primary) hypertension: Secondary | ICD-10-CM | POA: Diagnosis present

## 2024-07-17 DIAGNOSIS — F325 Major depressive disorder, single episode, in full remission: Secondary | ICD-10-CM | POA: Diagnosis present

## 2024-07-17 DIAGNOSIS — D62 Acute posthemorrhagic anemia: Secondary | ICD-10-CM | POA: Diagnosis present

## 2024-07-17 DIAGNOSIS — Z7982 Long term (current) use of aspirin: Secondary | ICD-10-CM

## 2024-07-17 DIAGNOSIS — Z794 Long term (current) use of insulin: Secondary | ICD-10-CM

## 2024-07-17 DIAGNOSIS — J449 Chronic obstructive pulmonary disease, unspecified: Secondary | ICD-10-CM | POA: Diagnosis present

## 2024-07-17 DIAGNOSIS — J90 Pleural effusion, not elsewhere classified: Secondary | ICD-10-CM

## 2024-07-17 LAB — CBC
HCT: 31.9 % — ABNORMAL LOW (ref 39.0–52.0)
HCT: 29.8 % — ABNORMAL LOW (ref 39.0–52.0)
Hemoglobin: 10.6 g/dL — ABNORMAL LOW (ref 13.0–17.0)
Hemoglobin: 10.1 g/dL — ABNORMAL LOW (ref 13.0–17.0)
MCH: 32.4 pg (ref 26.0–34.0)
MCH: 32.6 pg (ref 26.0–34.0)
MCHC: 33.2 g/dL (ref 30.0–36.0)
MCHC: 33.9 g/dL (ref 30.0–36.0)
MCV: 97.6 fL (ref 80.0–100.0)
MCV: 96.1 fL (ref 80.0–100.0)
Platelets: 322 K/uL (ref 150–400)
Platelets: 257 K/uL (ref 150–400)
RBC: 3.27 MIL/uL — ABNORMAL LOW (ref 4.22–5.81)
RBC: 3.1 MIL/uL — ABNORMAL LOW (ref 4.22–5.81)
RDW: 12.3 % (ref 11.5–15.5)
RDW: 12.4 % (ref 11.5–15.5)
WBC: 6.7 K/uL (ref 4.0–10.5)
WBC: 6.5 K/uL (ref 4.0–10.5)
nRBC: 0 % (ref 0.0–0.2)
nRBC: 0 % (ref 0.0–0.2)

## 2024-07-17 LAB — BASIC METABOLIC PANEL WITH GFR
Anion gap: 11 (ref 5–15)
BUN: 18 mg/dL (ref 8–23)
CO2: 27 mmol/L (ref 22–32)
Calcium: 9.5 mg/dL (ref 8.9–10.3)
Chloride: 103 mmol/L (ref 98–111)
Creatinine, Ser: 1.02 mg/dL (ref 0.61–1.24)
GFR, Estimated: >60 mL/min (ref >60)
Glucose, Bld: 139 mg/dL — ABNORMAL HIGH (ref 70–99)
Potassium: 4.5 mmol/L (ref 3.5–5.1)
Sodium: 141 mmol/L (ref 135–145)

## 2024-07-17 LAB — PRO BRAIN NATRIURETIC PEPTIDE: Pro Brain Natriuretic Peptide: 2857 pg/mL — ABNORMAL HIGH (ref <300.0)

## 2024-07-17 LAB — HEPATIC FUNCTION PANEL
ALT: 33 U/L (ref 0–44)
AST: 46 U/L — ABNORMAL HIGH (ref 15–41)
Albumin: 3.6 g/dL (ref 3.5–5.0)
Alkaline Phosphatase: 102 U/L (ref 38–126)
Bilirubin, Direct: <0.1 mg/dL (ref 0.0–0.2)
Total Bilirubin: 0.5 mg/dL (ref 0.0–1.2)
Total Protein: 6.9 g/dL (ref 6.5–8.1)

## 2024-07-17 LAB — CREATININE, SERUM
Creatinine, Ser: 1 mg/dL (ref 0.61–1.24)
GFR, Estimated: >60 mL/min (ref >60)

## 2024-07-17 LAB — CBG MONITORING, ED: Glucose-Capillary: 286 mg/dL — ABNORMAL HIGH (ref 70–99)

## 2024-07-17 LAB — TROPONIN T, HIGH SENSITIVITY
Troponin T High Sensitivity: 31 ng/L — ABNORMAL HIGH (ref 0–19)
Troponin T High Sensitivity: 26 ng/L — ABNORMAL HIGH (ref 0–19)

## 2024-07-17 MED ORDER — METOPROLOL TARTRATE 25 MG PO TABS
25.0000 mg | ORAL_TABLET | Freq: Once | ORAL | Status: AC
  Administered 2024-07-17: 25 mg via ORAL
  Filled 2024-07-17: qty 1

## 2024-07-17 MED ORDER — IOHEXOL 350 MG/ML SOLN
100.0000 mL | Freq: Once | INTRAVENOUS | Status: AC | PRN
  Administered 2024-07-17: 80 mL via INTRAVENOUS

## 2024-07-17 MED ORDER — HEPARIN (PORCINE) 25000 UT/250ML-% IV SOLN
1350.0000 [IU]/h | INTRAVENOUS | Status: DC
  Administered 2024-07-17 (×2): 900 [IU]/h via INTRAVENOUS
  Administered 2024-07-18: 1100 [IU]/h via INTRAVENOUS
  Administered 2024-07-19 – 2024-07-20 (×2): 1350 [IU]/h via INTRAVENOUS
  Filled 2024-07-17 (×4): qty 250

## 2024-07-17 MED ORDER — INSULIN ASPART 100 UNIT/ML IJ SOLN
0.0000 [IU] | Freq: Three times a day (TID) | INTRAMUSCULAR | Status: DC
  Administered 2024-07-18: 15 [IU] via SUBCUTANEOUS
  Administered 2024-07-18: 5 [IU] via SUBCUTANEOUS
  Filled 2024-07-17: qty 5
  Filled 2024-07-17: qty 15

## 2024-07-17 MED ORDER — MORPHINE SULFATE (PF) 4 MG/ML IV SOLN
4.0000 mg | Freq: Once | INTRAVENOUS | Status: AC
  Administered 2024-07-17: 4 mg via INTRAVENOUS
  Filled 2024-07-17: qty 1

## 2024-07-17 MED ORDER — ONDANSETRON HCL 4 MG/2ML IJ SOLN
4.0000 mg | Freq: Once | INTRAMUSCULAR | Status: AC
  Administered 2024-07-17: 4 mg via INTRAVENOUS
  Filled 2024-07-17: qty 2

## 2024-07-17 MED ORDER — FUROSEMIDE 10 MG/ML IJ SOLN: 20.0000 mg | Freq: Once | INTRAMUSCULAR | Status: DC

## 2024-07-17 MED ORDER — INSULIN ASPART 100 UNIT/ML IJ SOLN
0.0000 [IU] | Freq: Every day | INTRAMUSCULAR | Status: DC
  Administered 2024-07-17: 3 [IU] via SUBCUTANEOUS
  Filled 2024-07-17: qty 3

## 2024-07-17 MED ORDER — ASPIRIN 325 MG PO TBEC
325.0000 mg | DELAYED_RELEASE_TABLET | Freq: Once | ORAL | Status: AC
  Administered 2024-07-17: 325 mg via ORAL
  Filled 2024-07-17: qty 1

## 2024-07-17 MED ORDER — NITROGLYCERIN IN D5W 200-5 MCG/ML-% IV SOLN
0.0000 ug/min | INTRAVENOUS | Status: DC
  Administered 2024-07-17: 50 ug/min via INTRAVENOUS
  Administered 2024-07-18 – 2024-07-19 (×5): 120 ug/min via INTRAVENOUS
  Administered 2024-07-20: 130 ug/min via INTRAVENOUS
  Administered 2024-07-20 (×3): 120 ug/min via INTRAVENOUS
  Administered 2024-07-21: 60 ug/min via INTRAVENOUS
  Administered 2024-07-21: 140 ug/min via INTRAVENOUS
  Administered 2024-07-22: 55 ug/min via INTRAVENOUS
  Administered 2024-07-22: 25 ug/min via INTRAVENOUS
  Filled 2024-07-17 (×17): qty 250

## 2024-07-17 MED ORDER — LABETALOL HCL 5 MG/ML IV SOLN
20.0000 mg | Freq: Once | INTRAVENOUS | Status: AC
  Administered 2024-07-17: 20 mg via INTRAVENOUS
  Filled 2024-07-17: qty 4

## 2024-07-17 MED ORDER — HEPARIN BOLUS VIA INFUSION
4000.0000 [IU] | Freq: Once | INTRAVENOUS | Status: AC
  Administered 2024-07-17: 4000 [IU] via INTRAVENOUS

## 2024-07-17 MED ORDER — FUROSEMIDE 10 MG/ML IJ SOLN
40.0000 mg | Freq: Once | INTRAMUSCULAR | Status: AC
  Administered 2024-07-17: 40 mg via INTRAVENOUS
  Filled 2024-07-17: qty 4

## 2024-07-17 MED ORDER — ACETAMINOPHEN 500 MG PO TABS
1000.0000 mg | ORAL_TABLET | Freq: Once | ORAL | Status: AC
  Administered 2024-07-17: 1000 mg via ORAL
  Filled 2024-07-17: qty 2

## 2024-07-17 MED ORDER — METOPROLOL TARTRATE 5 MG/5ML IV SOLN
5.0000 mg | Freq: Once | INTRAVENOUS | Status: AC
  Administered 2024-07-17: 5 mg via INTRAVENOUS
  Filled 2024-07-17: qty 5

## 2024-07-17 NOTE — Progress Notes (Addendum)
 ON-CALL CARDIOLOGY 07/17/24  Patient's name: Oscar Moses.   MRN: [id number].    DOB: 1950-12-16 Primary care provider: Onita Norleen, MD.  Requesting Physician: Jerrol Agent, MD  Interaction regarding this patient's care today: ED physician reaches out to discuss his case.  Patient presents to the hospital with chest pressure and dyspnea Shortness of breath has been ongoing for the last 1 month, chest pain for the last 1 week, both progressive Chest pain is worse with exertion and improves with resting. Shortness of breath is worse with laying flat as opposed to sitting up.  Hypertensive crisis on arrival with SBP greater than 190 mmHg High sensitive troponins mildly elevated, continues to be trended EKG does not illustrate STEMI criteria proBNP significantly elevated CT chest notes bilateral pleural effusion  Temp:  [97.9 F (36.6 C)] 97.9 F (36.6 C) (11/10 1122) Pulse Rate:  [101-106] 106 (11/10 1423) Resp:  [17-18] 17 (11/10 1423) BP: (172-198)/(110-128) 172/110 (11/10 1423) SpO2:  [97 %-98 %] 98 % (11/10 1423)     Lab Results  Component Value Date   NA 141 07/17/2024   K 4.5 07/17/2024   CO2 27 07/17/2024   GLUCOSE 139 (H) 07/17/2024   BUN 18 07/17/2024   CREATININE 1.02 07/17/2024   CALCIUM 9.5 07/17/2024   GFRNONAA >60 07/17/2024   BNP No results found for: BNP  ProBNP    Component Value Date/Time   PROBNP 2,857.0 (H) 07/17/2024 1129   Impression / Recommendations: Precordial pain -suggestive of cardiac discomfort. Shortness of breath -suggestive of new onset heart failure Cardiac chest pain as described by ED physician/patient, mild elevation in high since troponins Recommend IV heparin drip as long as no contraindication as workup is ongoing Continue to trend troponins IV nitro drip for blood blood pressure and chest pain IV Lasix 40 mg IV push x 1 followed by 20 mg IV push daily Cardiology to consult the medicine to admit Further  recommendations  Bilateral pleural effusion: Per primary  No charge.   Madonna Michele HAS, [facility] [address] [facility]  A Division of [facility] [address], [place], [place] [address]  07/17/2024 2:27 PM

## 2024-07-17 NOTE — ED Notes (Signed)
 Pt seen in triage for shortness of breath. Ambulating SpO2 from lobby to triage 98% on room air. Pt bilateral breath sounds with scattered rhonchi without wheezing. Pt endorses that deep breathing makes him dizzy. Pt with cough that is productive of white sputum. RT will continue to be available as needed.

## 2024-07-17 NOTE — Plan of Care (Addendum)
 Hospital Medicine Transfer Accept Note Patient Name/Age: MAKAIO MACH / 73 y.o. MRN: [id number] Admission Date: 07/17/2024  Once successfully transferred to the appropriate floor, TRH will assume care for the patient above.  A/P: 24M no PMH p/w 1 month SOB and chest pressure (small child sitting on chest). Labs notable for BNP >2800, and troponin 31. CXR w/ moderate b/l pleural eff. EDP consulted cards given pt with ongoing cp c/f NSTEMI; thus requested [facility] transfer for IP cards eval. On IV lasix, nitroglycerin gtt, and IV hep gtt. Will needs Cards eval on arrival.  Marsha Ada, MD Attending Physician Division of [facility] Medicine [facility] July 17, 2024 2:40 PM

## 2024-07-17 NOTE — ED Notes (Signed)
 Pt ok to eat per Dr. Yolande

## 2024-07-17 NOTE — ED Provider Notes (Signed)
 Alerted by nursing staff that patient has had a rhythm change.  EKG has been performed, patient appears to be in atrial fibrillation with rapid ventricular rate in the 119 range.  I do not see any previous history of this.  He is already heparinized because of concern for acute coronary syndrome.  Perhaps this further explains the patient's ongoing symptoms of shortness of breath.  Will provide rate control, hospitalization appropriate.   EKG Interpretation Date/Time:  Monday July 17 2024 23:28:56 EST Ventricular Rate:  119 PR Interval:  192 QRS Duration:  85 QT Interval:  372 QTC Calculation: 524 R Axis:   65  Text Interpretation: Atrial fibrillation Borderline T wave abnormalities Prolonged QT interval Confirmed by Haze Lonni PARAS [phone]) on 07/17/2024 11:39:51 PM          Haze, Lonni PARAS, MD 07/17/24 2340

## 2024-07-17 NOTE — Consult Note (Incomplete)
 Cardiology Consultation   Patient ID: OSAZE HUBBERT MRN: [id number]; DOB: [birth year]  Admit date: 07/17/2024 Date of Consult: 07/17/2024  PCP:  Onita Norleen, MD   [facility] Providers Cardiologist:  None   { Click here to update MD or APP on Care Team, Refresh:1}     Patient Profile: Oscar Moses is a 73 y.o. male with a history of type 2 diabetes mellitus and chronic back pain who is being seen 07/17/2024 for the evaluation of shortness of breath and chest pain at the request of Dr. Jerrol.   History of Present Illness: Oscar Moses is a 73 year old male with no known cardiac history prior to this admission. He presented to the [facility] ED today for further evaluation of shortness of breath and chest pain.   Upon arrival to the ED, patient noted to be markedly hypertensive with systolic BP as high as 227. EKG showed normal sinus rhythm, rate 84 bpm, with no acute ischemic changes. High-sensitivity troponin minimally elevated and flat at 31 >> 26. Pro-BNP 2,857. Chest x-ray showed blunting of bilateral costophrenic angles (right > left). Chest CTA negative for PE but showed moderate bilateral pleural effusion and a small pericardial effusion. Lower extremity venous dopplers were negative for DVT bilaterally. WBC 6.7, Hgb 10.6, Plts 322. Na 141, K 4.5, Glucose 139, BUN 18, Cr 1.02. Albumin 3.6, AST 46, ALT 33, Alk Phos 102, Total Bili 0.5. He was given 324mg  of Aspirin and started on IV Heparin and IV Nitroglycerin. He was also given a dose of Labetalol and a dose of IV Lasix. He was transferred to [facility] for admission under the Internal Medicine service and Cardiology consulted for further evaluation.   ***   Past Medical History:  Diagnosis Date   Diabetes mellitus without complication (HCC)     Past Surgical History:  Procedure Laterality Date   BACK SURGERY       {Home Medications (Optional):21181}  Scheduled Meds:  Continuous Infusions:  heparin 900  Units/hr (07/17/24 1446)   nitroGLYCERIN 90 mcg/min (07/17/24 1456)   PRN Meds:   Allergies:   No Known Allergies  Social History:   Social History   Socioeconomic History   Marital status: Married    Spouse name: Not on file   Number of children: Not on file   Years of education: Not on file   Highest education level: Not on file  Occupational History   Not on file  Tobacco Use   Smoking status: Former    Types: Cigarettes   Smokeless tobacco: Never  Substance and Sexual Activity   Alcohol use: Not Currently   Drug use: Never   Sexual activity: Not on file  Other Topics Concern   Not on file  Social History Narrative   Not on file   Social Drivers of Health   Financial Resource Strain: Not on file  Food Insecurity: Not on file  Transportation Needs: Not on file  Physical Activity: Not on file  Stress: Not on file  Social Connections: Not on file  Intimate Partner Violence: Not on file    Family History:   ***History reviewed. No pertinent family history.   ROS:  Please see the history of present illness.  *** All other ROS reviewed and negative.     Physical Exam/Data: Vitals:   07/17/24 1530 07/17/24 1545 07/17/24 1600 07/17/24 1614  BP: (!) 139/91 (!) 128/91 138/88   Pulse: 75 79 85   Resp:   14  Temp:    98 F (36.7 C)  TempSrc:    Oral  SpO2: 95%  95%   Weight:      Height:       No intake or output data in the 24 hours ending 07/17/24 1620    07/17/2024    2:23 PM 01/01/2023    3:39 PM  Last 3 Weights  Weight (lbs) 156 lb 15.5 oz 165 lb  Weight (kg) 71.2 kg 74.844 kg     Body mass index is 23.18 kg/m.  General:  Well nourished, well developed, in no acute distress*** HEENT: normal Neck: no JVD Vascular: No carotid bruits; Distal pulses 2+ bilaterally Cardiac:  normal S1, S2; RRR; no murmur *** Lungs:  clear to auscultation bilaterally, no wheezing, rhonchi or rales  Abd: soft, nontender, no hepatomegaly  Ext: no  edema Musculoskeletal:  No deformities, BUE and BLE strength normal and equal Skin: warm and dry  Neuro:  CNs 2-12 intact, no focal abnormalities noted Psych:  Normal affect   EKG:  The EKG was personally reviewed and demonstrates:  *** Telemetry:  Telemetry was personally reviewed and demonstrates:  ***  Relevant CV Studies: ***  Laboratory Data: High Sensitivity Troponin:  No results for input(s): TROPONINIHS in the last 720 hours.   Chemistry Recent Labs  Lab 07/17/24 1129 07/17/24 1442  NA 141  --   K 4.5  --   CL 103  --   CO2 27  --   GLUCOSE 139*  --   BUN 18  --   CREATININE 1.02 1.00  CALCIUM 9.5  --   GFRNONAA >60 >60  ANIONGAP 11  --     Recent Labs  Lab 07/17/24 1443  PROT 6.9  ALBUMIN 3.6  AST 46*  ALT 33  ALKPHOS 102  BILITOT 0.5   Lipids No results for input(s): CHOL, TRIG, HDL, LABVLDL, LDLCALC, CHOLHDL in the last 168 hours.  Hematology Recent Labs  Lab 07/17/24 1129 07/17/24 1442  WBC 6.7 6.5  RBC 3.27* 3.10*  HGB 10.6* 10.1*  HCT 31.9* 29.8*  MCV 97.6 96.1  MCH 32.4 32.6  MCHC 33.2 33.9  RDW 12.3 12.4  PLT 322 257   Thyroid No results for input(s): TSH, FREET4 in the last 168 hours.  BNP Recent Labs  Lab 07/17/24 1129  PROBNP 2,857.0*    DDimer No results for input(s): DDIMER in the last 168 hours.  Radiology/Studies:  US  Venous Img Lower Bilateral Result Date: 07/17/2024 CLINICAL DATA:  73 year old male with lower extremity swelling. EXAM: BILATERAL LOWER EXTREMITY VENOUS DOPPLER ULTRASOUND TECHNIQUE: Gray-scale sonography with graded compression, as well as color Doppler and duplex ultrasound were performed to evaluate the lower extremity deep venous systems from the level of the common femoral vein and including the common femoral, femoral, profunda femoral, popliteal and calf veins including the posterior tibial, peroneal and gastrocnemius veins when visible. The superficial great saphenous vein was also  interrogated. Spectral Doppler was utilized to evaluate flow at rest and with distal augmentation maneuvers in the common femoral, femoral and popliteal veins. COMPARISON:  None Available. FINDINGS: RIGHT LOWER EXTREMITY Common Femoral Vein: No evidence of thrombus. Normal compressibility, respiratory phasicity and response to augmentation. Saphenofemoral Junction: No evidence of thrombus. Normal compressibility and flow on color Doppler imaging. Profunda Femoral Vein: No evidence of thrombus. Normal compressibility and flow on color Doppler imaging. Femoral Vein: No evidence of thrombus. Normal compressibility, respiratory phasicity and response to augmentation. Popliteal Vein: No evidence of  thrombus. Normal compressibility, respiratory phasicity and response to augmentation. Calf Veins: No evidence of thrombus. Normal compressibility and flow on color Doppler imaging. Other Findings:  None. LEFT LOWER EXTREMITY Common Femoral Vein: No evidence of thrombus. Normal compressibility, respiratory phasicity and response to augmentation. Saphenofemoral Junction: No evidence of thrombus. Normal compressibility and flow on color Doppler imaging. Profunda Femoral Vein: No evidence of thrombus. Normal compressibility and flow on color Doppler imaging. Femoral Vein: No evidence of thrombus. Normal compressibility, respiratory phasicity and response to augmentation. Popliteal Vein: No evidence of thrombus. Normal compressibility, respiratory phasicity and response to augmentation. Calf Veins: No evidence of thrombus. Normal compressibility and flow on color Doppler imaging. Other Findings:  None. IMPRESSION: No evidence of bilateral lower extremity deep venous thrombosis. Ester Sides, MD [facility] Radiology Electronically Signed   By: Ester Sides M.D.   On: 07/17/2024 16:08   CT Angio Chest PE W and/or Wo Contrast Result Date: 07/17/2024 CLINICAL DATA:  Pulmonary  embolism suspected. Difficulty breathing, chest pain. EXAM: CT ANGIOGRAPHY CHEST WITH CONTRAST TECHNIQUE: Multidetector CT imaging of the chest was performed using the standard protocol during bolus administration of intravenous contrast. Multiplanar CT image reconstructions and MIPs were obtained to evaluate the vascular anatomy. RADIATION DOSE REDUCTION: This exam was performed according to the departmental dose-optimization program which includes automated exposure control, adjustment of the mA and/or kV according to patient size and/or use of iterative reconstruction technique. CONTRAST:  80mL OMNIPAQUE IOHEXOL 350 MG/ML SOLN COMPARISON:  None Available. FINDINGS: Cardiovascular: Negative for pulmonary embolus. Atherosclerotic calcification of the aorta, aortic valve and coronary arteries. Heart is enlarged. Small pericardial effusion. Mediastinum/Nodes: Thoracic inlet lymph nodes are not enlarged by CT size criteria. No pathologically enlarged mediastinal, hilar or axillary lymph nodes. Esophagus is grossly unremarkable. Lungs/Pleura: Image quality is degraded by expiratory phase imaging, creating added density in the lungs. Dependent atelectasis. Moderate bilateral pleural effusions. Calcified granulomas. Airway is unremarkable. Upper Abdomen: Gallstone. Low-attenuation lesion in the right kidney, incompletely visualized. No specific follow-up necessary. Probable renal vascular calcification on the right. Visualized portions of the liver, gallbladder, adrenal glands, kidneys, spleen, pancreas, stomach and bowel are otherwise grossly unremarkable. No upper abdominal adenopathy. Musculoskeletal: Degenerative changes in the spine.  Kyphosis. Review of the MIP images confirms the above findings. IMPRESSION: 1. Negative for pulmonary embolus. 2. Moderate bilateral pleural effusions. 3. Small pericardial effusion. 4. Cholelithiasis. 5. Aortic atherosclerosis (ICD10-I70.0). Coronary artery calcification.  Electronically Signed   By: Newell Eke M.D.   On: 07/17/2024 13:46   DG Chest 2 View Result Date: 07/17/2024 EXAM: 2 VIEW(S) XRAY OF THE CHEST 07/17/2024 11:37:00 AM COMPARISON: None available. CLINICAL HISTORY: Goltz of breath FINDINGS: LUNGS AND PLEURA: Right basilar opacities. Blunting of bilateral costophrenic angles, right greater than left. No pulmonary edema. No pneumothorax. HEART AND MEDIASTINUM: No acute abnormality of the cardiac and mediastinal silhouettes. BONES AND SOFT TISSUES: No acute osseous abnormality. IMPRESSION: 1. Right basilar opacities. 2. Blunting of bilateral costophrenic angles, right greater than left. Electronically signed by: Norleen Boxer MD 07/17/2024 12:21 PM EST RP Workstation: [id number]     Assessment and Plan:  New Onset CHF - EF Uknown Patient presented with shortness of breath and chest pain as well as lower extremity edema. Pro-BNP 2,857. Chest x-ray showed blunting of bilateral costophrenic angles (right > left). Chest CTA negative for PE but showed moderate bilateral pleural effusion and a small pericardial effusion. Lower extremity venous dopplers were negative for DVT bilaterally. He was  given a dose of IV Lasix 40mg  in the ED. *** - *** - Will order Echo. - Continue IV Lasix *** - If EF is down, will need to add GDMT.   Chest Pain Patient presented with chest pain ***. EKG showed no acute ischemic changes. High-sensitivity troponin minimally elevated and flat at 31 >> 26. -   Hypertensive Emergency Systolic BP as high as 227 in the ED. He was started on IV Nitroglycerin and given one dose of Labetolol with improvement. ***  Type 2 Diabetes Mellitus ***  Risk Assessment/Risk Scores: {Complete the following score calculators/questions to meet required metrics.  Press F2         :[id number]}   {Is the patient being seen for unstable angina, ACS, NSTEMI or STEMI?:[phone]} {Does this patient have CHF or CHF symptoms?      :[id number]} {Does  this patient have ATRIAL FIBRILLATION?:[phone]}  {Are we signing off today?:[id number]}  For questions or updates, please contact [facility] Please consult [email or web address] for contact info under    {TIP  Split Shared Billing  Do NOT delete any part of this including brackets If split shared billing is based upon MDM, disregard If billing will be based upon TIME you MUST document the number of minutes and a detailed list of what was done in that time in the following format Example - I spent ** minutes seeing this patient. During that time I reviewed their history, evaluated their symptoms, reviewed available labs, EKGs, studies, performed an exam and formulated an assessment and plan   :1} {Select this only if you need to document critical care time (Optional):[phone]} Signed, Keondre Markson E Sherida Dobkins, PA-C  07/17/2024 4:20 PM

## 2024-07-17 NOTE — ED Triage Notes (Addendum)
 Last couple of days I can't breathe good Some episodes of chest pain over the last month Reports  no energy

## 2024-07-17 NOTE — ED Provider Notes (Signed)
 [place] EMERGENCY DEPARTMENT AT [facility] Provider Note   CSN: [id number] Arrival date & time: 07/17/24  1113     Patient presents with: Shortness of Breath   Oscar Moses is a 73 y.o. male.    Shortness of Breath    73 year old male with medical history significant for diabetes mellitus who presents to the emergency department with a chief complaint of dyspnea.  The patient states that he has been somewhat dyspneic over the past month.  He endorses lightheadedness with associated deep breathing.  He has a cough that is productive of white sputum.  He also endorses chest heaviness stating that it feels like a small child is sitting on my chest.  The patient states that the chest heaviness has been present for the past week.  He also endorses asymmetric right lower extremity swelling compared to the left.  He denies any history of DVT or PE.  He is not on anticoagulation.  He denies any fevers or chills.  He endorses fatigue.  Prior to Admission medications   Medication Sig Start Date End Date Taking? Authorizing Provider  Continuous Glucose Sensor (FREESTYLE LIBRE 3 SENSOR) MISC 1 each by Other route every 14 (fourteen) days. 04/07/24  Yes [provider]  HYDROcodone-acetaminophen (NORCO) 10-325 MG tablet Take 1 tablet by mouth 4 (four) times daily as needed.    [provider]    Allergies: Patient has no known allergies.    Review of Systems  Respiratory:  Positive for shortness of breath.     Updated Vital Signs BP (!) 199/111   Pulse (!) 106   Temp 97.9 F (36.6 C) (Oral)   Resp 17   Ht 5' 9 (1.753 m)   Wt 71.2 kg   SpO2 98%   BMI 23.18 kg/m   Physical Exam Vitals and nursing note reviewed.  Constitutional:      General: He is not in acute distress.    Appearance: He is well-developed.  HENT:     Head: Normocephalic and atraumatic.  Eyes:     Conjunctiva/sclera: Conjunctivae normal.  Cardiovascular:     Rate and Rhythm:  Normal rate and regular rhythm.     Heart sounds: No murmur heard. Pulmonary:     Effort: Pulmonary effort is normal. No respiratory distress.     Breath sounds: Normal breath sounds.  Abdominal:     Palpations: Abdomen is soft.     Tenderness: There is no abdominal tenderness.  Musculoskeletal:        General: No swelling.     Cervical back: Neck supple.  Skin:    General: Skin is warm and dry.     Capillary Refill: Capillary refill takes less than 2 seconds.  Neurological:     Mental Status: He is alert.  Psychiatric:        Mood and Affect: Mood normal.     (all labs ordered are listed, but only abnormal results are displayed) Labs Reviewed  BASIC METABOLIC PANEL WITH GFR - Abnormal; Notable for the following components:      Result Value   Glucose, Bld 139 (*)    All other components within normal limits  CBC - Abnormal; Notable for the following components:   RBC 3.27 (*)    Hemoglobin 10.6 (*)    HCT 31.9 (*)    All other components within normal limits  PRO BRAIN NATRIURETIC PEPTIDE - Abnormal; Notable for the following components:   Pro Brain Natriuretic Peptide 2,857.0 (*)  All other components within normal limits  TROPONIN T, HIGH SENSITIVITY - Abnormal; Notable for the following components:   Troponin T High Sensitivity 31 (*)    All other components within normal limits  HEPARIN LEVEL (UNFRACTIONATED)  HEPATIC FUNCTION PANEL  TROPONIN T, HIGH SENSITIVITY    EKG: EKG Interpretation Date/Time:  Monday July 17 2024 11:24:05 EST Ventricular Rate:  84 PR Interval:  192 QRS Duration:  86 QT Interval:  380 QTC Calculation: 449 R Axis:   58  Text Interpretation: Normal sinus rhythm with sinus arrhythmia Normal ECG When compared with ECG of 01-Jan-2023 15:43, No significant change was found Confirmed by Jerrol Agent ([id number]) on 07/17/2024 11:39:28 AM  Radiology: CT Angio Chest PE W and/or Wo Contrast Result Date: 07/17/2024 CLINICAL DATA:  Pulmonary  embolism suspected. Difficulty breathing, chest pain. EXAM: CT ANGIOGRAPHY CHEST WITH CONTRAST TECHNIQUE: Multidetector CT imaging of the chest was performed using the standard protocol during bolus administration of intravenous contrast. Multiplanar CT image reconstructions and MIPs were obtained to evaluate the vascular anatomy. RADIATION DOSE REDUCTION: This exam was performed according to the departmental dose-optimization program which includes automated exposure control, adjustment of the mA and/or kV according to patient size and/or use of iterative reconstruction technique. CONTRAST:  80mL OMNIPAQUE IOHEXOL 350 MG/ML SOLN COMPARISON:  None Available. FINDINGS: Cardiovascular: Negative for pulmonary embolus. Atherosclerotic calcification of the aorta, aortic valve and coronary arteries. Heart is enlarged. Small pericardial effusion. Mediastinum/Nodes: Thoracic inlet lymph nodes are not enlarged by CT size criteria. No pathologically enlarged mediastinal, hilar or axillary lymph nodes. Esophagus is grossly unremarkable. Lungs/Pleura: Image quality is degraded by expiratory phase imaging, creating added density in the lungs. Dependent atelectasis. Moderate bilateral pleural effusions. Calcified granulomas. Airway is unremarkable. Upper Abdomen: Gallstone. Low-attenuation lesion in the right kidney, incompletely visualized. No specific follow-up necessary. Probable renal vascular calcification on the right. Visualized portions of the liver, gallbladder, adrenal glands, kidneys, spleen, pancreas, stomach and bowel are otherwise grossly unremarkable. No upper abdominal adenopathy. Musculoskeletal: Degenerative changes in the spine.  Kyphosis. Review of the MIP images confirms the above findings. IMPRESSION: 1. Negative for pulmonary embolus. 2. Moderate bilateral pleural effusions. 3. Small pericardial effusion. 4. Cholelithiasis. 5. Aortic atherosclerosis (ICD10-I70.0). Coronary artery calcification.  Electronically Signed   By: Newell Eke M.D.   On: 07/17/2024 13:46   DG Chest 2 View Result Date: 07/17/2024 EXAM: 2 VIEW(S) XRAY OF THE CHEST 07/17/2024 11:37:00 AM COMPARISON: None available. CLINICAL HISTORY: [place] of breath FINDINGS: LUNGS AND PLEURA: Right basilar opacities. Blunting of bilateral costophrenic angles, right greater than left. No pulmonary edema. No pneumothorax. HEART AND MEDIASTINUM: No acute abnormality of the cardiac and mediastinal silhouettes. BONES AND SOFT TISSUES: No acute osseous abnormality. IMPRESSION: 1. Right basilar opacities. 2. Blunting of bilateral costophrenic angles, right greater than left. Electronically signed by: Norleen Boxer MD 07/17/2024 12:21 PM EST RP Workstation: [id number]     Ultrasound ED Echo  Date/Time: 07/17/2024 1:58 PM  Performed by: Jerrol Agent, MD Authorized by: Jerrol Agent, MD   Procedure details:    Indications: dyspnea     Views: subxiphoid, parasternal long axis view, parasternal Plack axis view, apical 4 chamber view and IVC view     Images: archived   Findings:    Pericardium: small pericardial effusion     LV Function: normal (>50% EF)     IVC: dilated   Impression:    Impression: pericardial effusion present and probable elevated CVP   .Critical Care  Performed by:  Jerrol Agent, MD Authorized by: Jerrol Agent, MD   Critical care provider statement:    Critical care time (minutes):  30   Critical care was time spent personally by me on the following activities:  Development of treatment plan with patient or surrogate, discussions with consultants, evaluation of patient's response to treatment, examination of patient, ordering and review of laboratory studies, ordering and review of radiographic studies, ordering and performing treatments and interventions, pulse oximetry, re-evaluation of patient's condition and review of old charts   Care discussed with: admitting provider      Medications Ordered in  the ED  nitroGLYCERIN 50 mg in dextrose 5 % 250 mL (0.2 mg/mL) infusion (50 mcg/min Intravenous New Bag/Given 07/17/24 1432)  heparin bolus via infusion 4,000 Units (has no administration in time range)  heparin ADULT infusion 100 units/mL (25000 units/250mL) (has no administration in time range)  iohexol (OMNIPAQUE) 350 MG/ML injection 100 mL (80 mLs Intravenous Contrast Given 07/17/24 1318)  furosemide (LASIX) injection 40 mg (40 mg Intravenous Given 07/17/24 1437)  aspirin EC tablet 325 mg (325 mg Oral Given 07/17/24 1439)    Clinical Course as of 07/17/24 1440  Mon Jul 17, 2024  1227 Pro Brain Natriuretic Peptide(!): 2,857.0 [JL]  1227 Troponin T High Sensitivity(!): 31 [JL]    Clinical Course User Index [JL] Jerrol Agent, MD                                 Medical Decision Making Amount and/or Complexity of Data Reviewed Labs: ordered. Decision-making details documented in ED Course. Radiology: ordered.  Risk OTC drugs. Prescription drug management. Decision regarding hospitalization.    73 year old male with medical history significant for diabetes mellitus who presents to the emergency department with a chief complaint of dyspnea.  The patient states that he has been somewhat dyspneic over the past month.  He endorses lightheadedness with associated deep breathing.  He has a cough that is productive of white sputum.  He also endorses chest heaviness stating that it feels like a small child is sitting on my chest.  The patient states that the chest heaviness has been present for the past week.  He also endorses asymmetric right lower extremity swelling compared to the left.  He denies any history of DVT or PE.  He is not on anticoagulation.  He denies any fevers or chills.  He endorses fatigue.  On arrival, the patient was afebrile, tachycardic heart rate 101, not tachypneic RR 18, blood pressure hypertensive 198/128, saturating 97% on room air.  Patient on exam had a  generally unremarkable physical exam.  Endorsing asymmetric swelling, substernal chest pressure as well as dyspnea and orthopnea.  Does state that symptoms improved when sitting up and leaning forward.  No known history of ACS/MI no known history of PE or DVT, no known history of CHF.  Considered all of these etiologies in addition to pericardial effusion/pericarditis, considered pleural effusions, considered viral infection, pneumonia.  Chest discomfort is not ripping or tearing, low concern for aortic dissection at this time.  Patient without wheezing on exam.   Initial EKG: Sinus rhythm, ventricular rate 84, no acute ischemic changes noted  Chest x-ray: Right basilar streaky opacities, blunting of the costophrenic angles, no other acute findings  Labs: Cardiac troponin initially 31, repeat pending, BNP elevated after adjusting for age at 2857, CBC without a leukocytosis, mild anemia to 10.6 noted, BMP unremarkable.  CTA  PE: IMPRESSION:  1. Negative for pulmonary embolus.  2. Moderate bilateral pleural effusions.  3. Small pericardial effusion.  4. Cholelithiasis.  5. Aortic atherosclerosis (ICD10-I70.0). Coronary artery  calcification.   Patient endorses ongoing chest pressure.  Discussed with on-call cardiology, Dr. Michele who reviewed the patient's workup and presentation and recommended initiation of heparin, 40 mg of IV Lasix x 1, IV nitroglycerin gtt. in the setting of hypertension.  Cardiology will see in consultation.  Medicine consulted for admission, Dr. Georgina accepting.     Final diagnoses:  Unstable angina (HCC)  Chest pain, unspecified type  Dyspnea, unspecified type  Pleural effusion  Pericardial effusion  Hypertensive emergency    ED Discharge Orders     None          Jerrol Agent, MD 07/17/24 1440

## 2024-07-17 NOTE — Progress Notes (Signed)
 PHARMACY - ANTICOAGULATION CONSULT NOTE  Pharmacy Consult for heparin Indication: chest pain/ACS  No Known Allergies  Patient Measurements: Height: 5' 9 (175.3 cm) Weight: 71.2 kg (156 lb 15.5 oz) IBW/kg (Calculated) : 70.7 HEPARIN DW (KG): 71.2  Vital Signs: Temp: 97.9 F (36.6 C) (11/10 1122) Temp Source: Oral (11/10 1122) BP: 199/111 (11/10 1430) Pulse Rate: 106 (11/10 1423)  Labs: Recent Labs    07/17/24 1129  HGB 10.6*  HCT 31.9*  PLT 322  CREATININE 1.02    Estimated Creatinine Clearance: 64.5 mL/min (by C-G formula based on SCr of 1.02 mg/dL).   Medical History: Past Medical History:  Diagnosis Date   Diabetes mellitus without complication (HCC)     Medications:  Infusions:   heparin     nitroGLYCERIN 50 mcg/min (07/17/24 1432)    Assessment: 73 yom presented to the ED with ShOB. Troponin mildly elevated and now starting IV heparin. Baseline Hgb is slightly low at 10.6 but platelets are WNL. He is not on anticoagulation PTA.   Goal of Therapy:  Heparin level 0.3-0.7 units/ml Monitor platelets by anticoagulation protocol: Yes   Plan:  Heparin bolus 4000 units IV x 1 Heparin gtt 900 units/hr Check an 8 hr heparin level Daily heparin level and CBC  Oscar Moses, Oscar Moses 07/17/2024,2:37 PM

## 2024-07-17 NOTE — ED Notes (Signed)
 Pt at 96% Spo2 and pluse 135 while ambulating

## 2024-07-17 NOTE — ED Notes (Signed)
 Pt unavailable at this time for ambulating pulse ox. RT to check back.

## 2024-07-17 NOTE — ED Notes (Addendum)
 RT called to patient's room d/t increased ShoB. Patient states it began after rolling onto right side. Patient currently on 3LNC, SpO2 96%. Lungs sounds CTAB. RN at bedside.  Patient requesting to sit up on side of bed. Patient states breathing has improved some with the change in position.

## 2024-07-17 NOTE — ED Notes (Addendum)
 After brief period of desating once patient fell asleep, RN placed patient on 2L [place].

## 2024-07-18 ENCOUNTER — Inpatient Hospital Stay (HOSPITAL_COMMUNITY)

## 2024-07-18 ENCOUNTER — Encounter (HOSPITAL_COMMUNITY): Payer: Self-pay | Admitting: Hospitalist

## 2024-07-18 ENCOUNTER — Other Ambulatory Visit: Payer: Self-pay

## 2024-07-18 DIAGNOSIS — K3189 Other diseases of stomach and duodenum: Secondary | ICD-10-CM | POA: Diagnosis not present

## 2024-07-18 DIAGNOSIS — R079 Chest pain, unspecified: Secondary | ICD-10-CM | POA: Diagnosis not present

## 2024-07-18 DIAGNOSIS — I25110 Atherosclerotic heart disease of native coronary artery with unstable angina pectoris: Secondary | ICD-10-CM | POA: Diagnosis present

## 2024-07-18 DIAGNOSIS — K922 Gastrointestinal hemorrhage, unspecified: Secondary | ICD-10-CM | POA: Diagnosis not present

## 2024-07-18 DIAGNOSIS — K921 Melena: Secondary | ICD-10-CM | POA: Diagnosis not present

## 2024-07-18 DIAGNOSIS — I214 Non-ST elevation (NSTEMI) myocardial infarction: Secondary | ICD-10-CM | POA: Diagnosis not present

## 2024-07-18 DIAGNOSIS — I129 Hypertensive chronic kidney disease with stage 1 through stage 4 chronic kidney disease, or unspecified chronic kidney disease: Secondary | ICD-10-CM | POA: Diagnosis not present

## 2024-07-18 DIAGNOSIS — I10 Essential (primary) hypertension: Secondary | ICD-10-CM

## 2024-07-18 DIAGNOSIS — N182 Chronic kidney disease, stage 2 (mild): Secondary | ICD-10-CM | POA: Diagnosis present

## 2024-07-18 DIAGNOSIS — I130 Hypertensive heart and chronic kidney disease with heart failure and stage 1 through stage 4 chronic kidney disease, or unspecified chronic kidney disease: Secondary | ICD-10-CM | POA: Diagnosis present

## 2024-07-18 DIAGNOSIS — F325 Major depressive disorder, single episode, in full remission: Secondary | ICD-10-CM | POA: Diagnosis present

## 2024-07-18 DIAGNOSIS — I480 Paroxysmal atrial fibrillation: Secondary | ICD-10-CM | POA: Diagnosis present

## 2024-07-18 DIAGNOSIS — I161 Hypertensive emergency: Secondary | ICD-10-CM | POA: Diagnosis present

## 2024-07-18 DIAGNOSIS — I160 Hypertensive urgency: Secondary | ICD-10-CM

## 2024-07-18 DIAGNOSIS — K5730 Diverticulosis of large intestine without perforation or abscess without bleeding: Secondary | ICD-10-CM | POA: Diagnosis not present

## 2024-07-18 DIAGNOSIS — I209 Angina pectoris, unspecified: Secondary | ICD-10-CM | POA: Diagnosis not present

## 2024-07-18 DIAGNOSIS — D509 Iron deficiency anemia, unspecified: Secondary | ICD-10-CM | POA: Diagnosis present

## 2024-07-18 DIAGNOSIS — E1122 Type 2 diabetes mellitus with diabetic chronic kidney disease: Secondary | ICD-10-CM | POA: Diagnosis present

## 2024-07-18 DIAGNOSIS — D62 Acute posthemorrhagic anemia: Secondary | ICD-10-CM | POA: Diagnosis present

## 2024-07-18 DIAGNOSIS — I509 Heart failure, unspecified: Secondary | ICD-10-CM | POA: Diagnosis not present

## 2024-07-18 DIAGNOSIS — G8929 Other chronic pain: Secondary | ICD-10-CM | POA: Diagnosis present

## 2024-07-18 DIAGNOSIS — D649 Anemia, unspecified: Secondary | ICD-10-CM | POA: Diagnosis not present

## 2024-07-18 DIAGNOSIS — G4700 Insomnia, unspecified: Secondary | ICD-10-CM | POA: Diagnosis present

## 2024-07-18 DIAGNOSIS — E11319 Type 2 diabetes mellitus with unspecified diabetic retinopathy without macular edema: Secondary | ICD-10-CM | POA: Diagnosis present

## 2024-07-18 DIAGNOSIS — I200 Unstable angina: Secondary | ICD-10-CM | POA: Diagnosis present

## 2024-07-18 DIAGNOSIS — K219 Gastro-esophageal reflux disease without esophagitis: Secondary | ICD-10-CM | POA: Diagnosis present

## 2024-07-18 DIAGNOSIS — E1039 Type 1 diabetes mellitus with other diabetic ophthalmic complication: Secondary | ICD-10-CM

## 2024-07-18 DIAGNOSIS — N179 Acute kidney failure, unspecified: Secondary | ICD-10-CM | POA: Diagnosis present

## 2024-07-18 DIAGNOSIS — K5520 Angiodysplasia of colon without hemorrhage: Secondary | ICD-10-CM | POA: Diagnosis not present

## 2024-07-18 DIAGNOSIS — Z794 Long term (current) use of insulin: Secondary | ICD-10-CM | POA: Diagnosis not present

## 2024-07-18 DIAGNOSIS — I5033 Acute on chronic diastolic (congestive) heart failure: Secondary | ICD-10-CM | POA: Diagnosis present

## 2024-07-18 DIAGNOSIS — J449 Chronic obstructive pulmonary disease, unspecified: Secondary | ICD-10-CM | POA: Diagnosis present

## 2024-07-18 DIAGNOSIS — E1139 Type 2 diabetes mellitus with other diabetic ophthalmic complication: Secondary | ICD-10-CM | POA: Diagnosis present

## 2024-07-18 DIAGNOSIS — J9601 Acute respiratory failure with hypoxia: Secondary | ICD-10-CM | POA: Diagnosis present

## 2024-07-18 DIAGNOSIS — E785 Hyperlipidemia, unspecified: Secondary | ICD-10-CM | POA: Diagnosis present

## 2024-07-18 DIAGNOSIS — K5521 Angiodysplasia of colon with hemorrhage: Secondary | ICD-10-CM | POA: Diagnosis present

## 2024-07-18 DIAGNOSIS — K635 Polyp of colon: Secondary | ICD-10-CM | POA: Diagnosis not present

## 2024-07-18 DIAGNOSIS — I3139 Other pericardial effusion (noninflammatory): Secondary | ICD-10-CM | POA: Diagnosis present

## 2024-07-18 DIAGNOSIS — E1165 Type 2 diabetes mellitus with hyperglycemia: Secondary | ICD-10-CM | POA: Diagnosis present

## 2024-07-18 DIAGNOSIS — Z7901 Long term (current) use of anticoagulants: Secondary | ICD-10-CM | POA: Diagnosis not present

## 2024-07-18 DIAGNOSIS — K449 Diaphragmatic hernia without obstruction or gangrene: Secondary | ICD-10-CM | POA: Diagnosis not present

## 2024-07-18 DIAGNOSIS — K2289 Other specified disease of esophagus: Secondary | ICD-10-CM | POA: Diagnosis not present

## 2024-07-18 LAB — GLUCOSE, CAPILLARY
Glucose-Capillary: 239 mg/dL — ABNORMAL HIGH (ref 70–99)
Glucose-Capillary: 178 mg/dL — ABNORMAL HIGH (ref 70–99)
Glucose-Capillary: 348 mg/dL — ABNORMAL HIGH (ref 70–99)

## 2024-07-18 LAB — COMPREHENSIVE METABOLIC PANEL WITH GFR
ALT: 25 U/L (ref 0–44)
AST: 27 U/L (ref 15–41)
Albumin: 2.8 g/dL — ABNORMAL LOW (ref 3.5–5.0)
Alkaline Phosphatase: 60 U/L (ref 38–126)
Anion gap: 10 (ref 5–15)
BUN: 16 mg/dL (ref 8–23)
CO2: 24 mmol/L (ref 22–32)
Calcium: 8.2 mg/dL — ABNORMAL LOW (ref 8.9–10.3)
Chloride: 103 mmol/L (ref 98–111)
Creatinine, Ser: 1.49 mg/dL — ABNORMAL HIGH (ref 0.61–1.24)
GFR, Estimated: 49 mL/min — ABNORMAL LOW (ref >60)
Glucose, Bld: 151 mg/dL — ABNORMAL HIGH (ref 70–99)
Potassium: 3.8 mmol/L (ref 3.5–5.1)
Sodium: 137 mmol/L (ref 135–145)
Total Bilirubin: 0.5 mg/dL (ref 0.0–1.2)
Total Protein: 5.9 g/dL — ABNORMAL LOW (ref 6.5–8.1)

## 2024-07-18 LAB — HEPARIN LEVEL (UNFRACTIONATED)
Heparin Unfractionated: 0.19 [IU]/mL — ABNORMAL LOW (ref 0.30–0.70)
Heparin Unfractionated: 0.3 [IU]/mL (ref 0.30–0.70)

## 2024-07-18 LAB — CBC
HCT: 26.5 % — ABNORMAL LOW (ref 39.0–52.0)
Hemoglobin: 8.9 g/dL — ABNORMAL LOW (ref 13.0–17.0)
MCH: 31.8 pg (ref 26.0–34.0)
MCHC: 33.6 g/dL (ref 30.0–36.0)
MCV: 94.6 fL (ref 80.0–100.0)
Platelets: 295 K/uL (ref 150–400)
RBC: 2.8 MIL/uL — ABNORMAL LOW (ref 4.22–5.81)
RDW: 12.2 % (ref 11.5–15.5)
WBC: 6.7 K/uL (ref 4.0–10.5)
nRBC: 0 % (ref 0.0–0.2)

## 2024-07-18 LAB — CBG MONITORING, ED
Glucose-Capillary: 407 mg/dL — ABNORMAL HIGH (ref 70–99)
Glucose-Capillary: 252 mg/dL — ABNORMAL HIGH (ref 70–99)

## 2024-07-18 LAB — HEMOGLOBIN A1C
Hgb A1c MFr Bld: 9.6 % — ABNORMAL HIGH (ref 4.8–5.6)
Mean Plasma Glucose: 228.82 mg/dL

## 2024-07-18 LAB — MAGNESIUM: Magnesium: 1.9 mg/dL (ref 1.7–2.4)

## 2024-07-18 MED ORDER — FUROSEMIDE 10 MG/ML IJ SOLN: 40.0000 mg | Freq: Two times a day (BID) | INTRAMUSCULAR | Status: DC

## 2024-07-18 MED ORDER — HEPARIN BOLUS VIA INFUSION
1000.0000 [IU] | Freq: Once | INTRAVENOUS | Status: AC
  Administered 2024-07-18: 1000 [IU] via INTRAVENOUS

## 2024-07-18 MED ORDER — TRAZODONE HCL 50 MG PO TABS
50.0000 mg | ORAL_TABLET | ORAL | Status: AC
  Administered 2024-07-18: 50 mg via ORAL
  Filled 2024-07-18: qty 1

## 2024-07-18 MED ORDER — FUROSEMIDE 10 MG/ML IJ SOLN
40.0000 mg | Freq: Two times a day (BID) | INTRAMUSCULAR | Status: DC
Start: 2024-07-18 — End: 2024-07-18

## 2024-07-18 MED ORDER — MORPHINE SULFATE (PF) 2 MG/ML IV SOLN
2.0000 mg | Freq: Once | INTRAVENOUS | Status: AC
  Administered 2024-07-18: 2 mg via INTRAVENOUS
  Filled 2024-07-18: qty 1

## 2024-07-18 MED ORDER — INSULIN ASPART 100 UNIT/ML IJ SOLN
0.0000 [IU] | Freq: Three times a day (TID) | INTRAMUSCULAR | Status: DC
  Administered 2024-07-18 – 2024-07-19 (×2): 3 [IU] via SUBCUTANEOUS
  Administered 2024-07-19: 15 [IU] via SUBCUTANEOUS
  Filled 2024-07-18: qty 15
  Filled 2024-07-18 (×2): qty 3

## 2024-07-18 MED ORDER — SODIUM CHLORIDE 0.9% FLUSH
3.0000 mL | Freq: Two times a day (BID) | INTRAVENOUS | Status: DC
  Administered 2024-07-18 – 2024-07-25 (×14): 3 mL via INTRAVENOUS

## 2024-07-18 MED ORDER — POLYETHYLENE GLYCOL 3350 17 G PO PACK
17.0000 g | PACK | Freq: Every day | ORAL | Status: DC | PRN
  Administered 2024-07-20: 17 g via ORAL
  Filled 2024-07-18: qty 1

## 2024-07-18 MED ORDER — SPIRONOLACTONE 25 MG PO TABS
25.0000 mg | ORAL_TABLET | Freq: Every day | ORAL | Status: DC
  Administered 2024-07-18: 25 mg via ORAL
  Filled 2024-07-18: qty 1

## 2024-07-18 MED ORDER — LISINOPRIL 20 MG PO TABS: 40.0000 mg | ORAL_TABLET | Freq: Two times a day (BID) | ORAL | Status: DC

## 2024-07-18 MED ORDER — ASPIRIN 81 MG PO TBEC
81.0000 mg | DELAYED_RELEASE_TABLET | Freq: Every day | ORAL | Status: DC
  Administered 2024-07-19 – 2024-07-25 (×6): 81 mg via ORAL
  Filled 2024-07-18 (×6): qty 1

## 2024-07-18 MED ORDER — METOPROLOL SUCCINATE ER 25 MG PO TB24
25.0000 mg | ORAL_TABLET | Freq: Every day | ORAL | Status: DC
Start: 2024-07-18 — End: 2024-07-19
  Administered 2024-07-18 – 2024-07-19 (×2): 25 mg via ORAL
  Filled 2024-07-18 (×2): qty 1

## 2024-07-18 MED ORDER — TRAZODONE HCL 50 MG PO TABS
75.0000 mg | ORAL_TABLET | Freq: Every evening | ORAL | Status: DC | PRN
  Administered 2024-07-18 – 2024-07-24 (×3): 75 mg via ORAL
  Filled 2024-07-18 (×3): qty 2

## 2024-07-18 MED ORDER — ACETAMINOPHEN 650 MG RE SUPP: 650.0000 mg | Freq: Four times a day (QID) | RECTAL | Status: DC | PRN

## 2024-07-18 MED ORDER — INSULIN ASPART 100 UNIT/ML IJ SOLN
0.0000 [IU] | Freq: Every day | INTRAMUSCULAR | Status: DC
  Administered 2024-07-18: 4 [IU] via SUBCUTANEOUS
  Filled 2024-07-18: qty 4

## 2024-07-18 MED ORDER — ACETAMINOPHEN 325 MG PO TABS
650.0000 mg | ORAL_TABLET | Freq: Four times a day (QID) | ORAL | Status: DC | PRN
Start: 2024-07-18 — End: 2024-07-24
  Administered 2024-07-20 – 2024-07-22 (×2): 650 mg via ORAL
  Filled 2024-07-18 (×2): qty 2

## 2024-07-18 MED ORDER — INSULIN GLARGINE-YFGN 100 UNIT/ML ~~LOC~~ SOLN
5.0000 [IU] | Freq: Every day | SUBCUTANEOUS | Status: DC
  Administered 2024-07-19: 5 [IU] via SUBCUTANEOUS
  Filled 2024-07-18 (×3): qty 0.05

## 2024-07-18 MED ORDER — SIMVASTATIN 20 MG PO TABS
20.0000 mg | ORAL_TABLET | Freq: Every morning | ORAL | Status: DC
  Administered 2024-07-19 – 2024-07-25 (×7): 20 mg via ORAL
  Filled 2024-07-18 (×7): qty 1

## 2024-07-18 MED ORDER — ACETAMINOPHEN 325 MG PO TABS
650.0000 mg | ORAL_TABLET | Freq: Four times a day (QID) | ORAL | Status: AC | PRN
  Administered 2024-07-18: 650 mg via ORAL
  Filled 2024-07-18: qty 2

## 2024-07-18 NOTE — Progress Notes (Signed)
 PHARMACY - ANTICOAGULATION CONSULT NOTE  Pharmacy Consult for heparin Indication: chest pain/ACS  No Known Allergies  Patient Measurements: Height: 5' 9 (175.3 cm) Weight: 71.2 kg (156 lb 15.5 oz) IBW/kg (Calculated) : 70.7 HEPARIN DW (KG): 71.2  Vital Signs: Temp: 98 F (36.7 C) (11/11 0058) Temp Source: Oral (11/11 0058) BP: 147/77 (11/11 0100) Pulse Rate: 67 (11/11 0100)  Labs: Recent Labs    07/17/24 1129 07/17/24 1442 07/18/24 0016  HGB 10.6* 10.1*  --   HCT 31.9* 29.8*  --   PLT 322 257  --   HEPARINUNFRC  --   --  0.19*  CREATININE 1.02 1.00  --     Estimated Creatinine Clearance: 65.8 mL/min (by C-G formula based on SCr of 1 mg/dL).   Medical History: Past Medical History:  Diagnosis Date   Diabetes mellitus without complication (HCC)     Medications:  Infusions:   heparin 900 Units/hr (07/17/24 1446)   nitroGLYCERIN 110 mcg/min (07/17/24 2156)    Assessment: Oscar Moses presented to the ED with ShOB. Troponin mildly elevated and now starting IV heparin. Baseline Hgb is slightly low at 10.6 but platelets are WNL. He is not on anticoagulation PTA.   AM: heparin level below goal on 900 units/hr. Per RN, no signs/symptoms of bleeding or issues with the heparin gtt running continuously.   Goal of Therapy:  Heparin level 0.3-0.7 units/ml Monitor platelets by anticoagulation protocol: Yes   Plan:  Heparin bolus 1000 units IV x 1 Heparin gtt 1100 units/hr Check an 8 hr heparin level Daily heparin level and CBC F/u cards eval when txr to [facility]   Lynwood Poplar, PharmD, BCPS Clinical Pharmacist 07/18/2024 1:34 AM

## 2024-07-18 NOTE — Progress Notes (Signed)
 PHARMACY - ANTICOAGULATION CONSULT NOTE  Pharmacy Consult for heparin Indication: chest pain/ACS  No Known Allergies  Patient Measurements: Height: 5' 9 (175.3 cm) Weight: 71.2 kg (156 lb 15.5 oz) IBW/kg (Calculated) : 70.7 HEPARIN DW (KG): 71.2  Vital Signs: Temp: 98.2 F (36.8 C) (11/11 0532) Temp Source: Oral (11/11 0532) BP: 144/65 (11/11 1015) Pulse Rate: 78 (11/11 1015)  Labs: Recent Labs    07/17/24 1129 07/17/24 1442 07/18/24 0016 07/18/24 1000  HGB 10.6* 10.1*  --   --   HCT 31.9* 29.8*  --   --   PLT 322 257  --   --   HEPARINUNFRC  --   --  0.19* 0.30  CREATININE 1.02 1.00  --   --     Estimated Creatinine Clearance: 65.8 mL/min (by C-G formula based on SCr of 1 mg/dL).  Medications:  Infusions:   heparin 1,200 Units/hr (07/18/24 1206)   nitroGLYCERIN 110 mcg/min (07/17/24 2156)    Assessment: 73 yom presented to the ED with ShOB. Troponin mildly elevated and now starting IV heparin. Baseline Hgb is slightly low at 10.6 but platelets are WNL. He is not on anticoagulation PTA.   Heparin level is now therapeutic but on the lower end of goal range.   Goal of Therapy:  Heparin level 0.3-0.7 units/ml Monitor platelets by anticoagulation protocol: Yes   Plan:  Increase heparin gtt to 1200 units/hr F/u cardiology plan Daily heparin level and CBC  Vernell Meier, PharmD, BCPS, BCEMP Clinical Pharmacist Please see AMION for all pharmacy numbers 07/18/2024 12:08 PM

## 2024-07-18 NOTE — Consult Note (Addendum)
 Cardiology Consultation   Patient ID: Oscar Moses MRN: [id number]; DOB: April 06, 1951  Admit date: 07/17/2024 Date of Consult: 07/18/2024  PCP:  Onita Norleen, MD   [facility] Providers Cardiologist:  None     Patient Profile: Oscar Moses is a 73 y.o. male with a hx of hx of hypertension, hyperlipidemia, T1DM with retinopathy and nephropathy, chronic anemia, depression, COPD, celiac disease, insomnia, GERD, osteoarthritis, and hx of tobacco use who is being seen 07/18/2024 for the evaluation of UA at the request of [name].  History of Present Illness: Mr. [name] on chart review patient has no prior cardiac history.    Presented to the ED on 11/10 for fatigue, progressive shortness of breath and several episodes of chest pain.  BP: 198/128   HR 101 ECG: Sinus rhythm VR 84 CXR basilar opacities and blunting of the costophrenic angles DVT ultrasound showed no evidence of DVT. CTA chest negative for pulmonary embolism, moderate bilateral pleural effusions, small pericardial effusion, aortic and coronary calcifications.  Cholelithiasis   Pertinent lab work: Unremarkable BMP    hgb 10.6     AST 46 proBNP 2857      Troponin 31 -> 26 A1c 9.6%  DOD was contacted yesterday, he recommended starting IV heparin, IV lasix, and start IV nitroglycerin for blood pressure and chest pain.  Troponins to be trended.  After this consult patient went into A-fib RVR with symptoms of shortness of breath.  Patient was given IV and oral Lopressor.  He then converted back into sinus rhythm ECG atrial fibrillation with PVC VR 119 though artifact is present ECG sinus rhythm with PVC PR 72  Patient just arrived from [facility], shortly before interview.  On interview, shared for the last year he has noticed increased fatigue and dyspnea on exertion. DOE progressively worsening, requiring less exertion prior to symptom onset. Then about 6 months ago started noticing chest pain with  exertion.  Chest pain is described as a heaviness to the left side, once it reaches a 4 out of 10 patient sits down and then eventually goes away with rest.  Patient notes he never has chest pain or shortness of breath at rest.  Did report that about 2 months ago started noticing orthopnea and PND.  Did have some mild bilateral lower extremity edema right worse than left that started about 2 weeks ago.  Denied abdominal discomfort or distention/denied change in appetite.  Denied palpitations.  Has some lightheaded and dizziness with positional change.  Daughter, [name], was in the room, shares concern to make sure that team would know that patient's spouse has Lewy body dementia and cannot consent for patient if something were to happen to him.  Patient confirmed.  Patient reported he would like [name] or [name] to make health care decisions if something were to happen.  Advised patient and daughter to also address with hospital team once they did round for admission  Did share he met with [name] about 6 months ago and had a CCTA that he was told was normal. Unable to chart review given out of system.    Past Medical History:  Diagnosis Date   Diabetes mellitus without complication (HCC)     Past Surgical History:  Procedure Laterality Date   BACK SURGERY         Scheduled Meds:  insulin aspart  0-15 Units Subcutaneous TID WC   insulin aspart  0-5 Units Subcutaneous QHS   Continuous Infusions:  heparin 1,200  Units/hr (07/18/24 1206)   nitroGLYCERIN 120 mcg/min (07/18/24 1233)   PRN Meds:   Allergies:   No Known Allergies  Social History:   Social History   Socioeconomic History   Marital status: Married    Spouse name: Not on file   Number of children: Not on file   Years of education: Not on file   Highest education level: Not on file  Occupational History   Not on file  Tobacco Use   Smoking status: Former    Types: Cigarettes   Smokeless tobacco: Never  Substance  and Sexual Activity   Alcohol use: Not Currently   Drug use: Never   Sexual activity: Not on file  Other Topics Concern   Not on file  Social History Narrative   Not on file   Social Drivers of Health   Financial Resource Strain: Not on file  Food Insecurity: Not on file  Transportation Needs: Not on file  Physical Activity: Not on file  Stress: Not on file  Social Connections: Not on file  Intimate Partner Violence: Not on file    Family History:   History reviewed. No pertinent family history.   ROS:  Please see the history of present illness.  All other ROS reviewed and negative.     Physical Exam/Data: Vitals:   07/18/24 0945 07/18/24 1000 07/18/24 1015 07/18/24 1225  BP: 125/66 134/68 (!) 144/65 (!) 177/79  Pulse: 70 73 78 77  Resp: (!) 23 (!) 23 16 16   Temp:    97.9 F (36.6 C)  TempSrc:    Oral  SpO2: 94% 95% 95% 97%  Weight:    71.2 kg  Height:    5' 9 (1.753 m)    Intake/Output Summary (Last 24 hours) at 07/18/2024 1319 Last data filed at 07/18/2024 0635 Gross per 24 hour  Intake --  Output 850 ml  Net -850 ml      07/18/2024   12:25 PM 07/17/2024    2:23 PM 01/01/2023    3:39 PM  Last 3 Weights  Weight (lbs) 156 lb 14.4 oz 156 lb 15.5 oz 165 lb  Weight (kg) 71.169 kg 71.2 kg 74.844 kg     Body mass index is 23.17 kg/m.  General:  Well nourished, well developed, in no acute distress HEENT: normal Neck: JVD with HJR Vascular: No carotid bruits; Distal pulses 2+ bilaterally Cardiac:  normal S1, S2; RRR; no murmur  Lungs:  clear to auscultation bilaterally, no wheezing, rhonchi or rales  Abd: soft, nontender, no hepatomegaly  Ext: no edema Musculoskeletal:  No deformities, BUE and BLE strength normal and equal Skin: warm and dry  Neuro:  CNs 2-12 intact, no focal abnormalities noted Psych:  Normal affect   EKG:  The EKG was personally reviewed and demonstrates:  See hpi Telemetry:  Telemetry was personally reviewed and demonstrates: Sinus  rhythm with frequent PVCs HR 80  Relevant CV Studies: None  Laboratory Data:   Chemistry Recent Labs  Lab 07/17/24 1129 07/17/24 1442  NA 141  --   K 4.5  --   CL 103  --   CO2 27  --   GLUCOSE 139*  --   BUN 18  --   CREATININE 1.02 1.00  CALCIUM 9.5  --   GFRNONAA >60 >60  ANIONGAP 11  --     Recent Labs  Lab 07/17/24 1443  PROT 6.9  ALBUMIN 3.6  AST 46*  ALT 33  ALKPHOS 102  BILITOT  0.5   Hematology Recent Labs  Lab 07/17/24 1129 07/17/24 1442  WBC 6.7 6.5  RBC 3.27* 3.10*  HGB 10.6* 10.1*  HCT 31.9* 29.8*  MCV 97.6 96.1  MCH 32.4 32.6  MCHC 33.2 33.9  RDW 12.3 12.4  PLT 322 257   BNP Recent Labs  Lab 07/17/24 1129  PROBNP 2,857.0*     Radiology/Studies:  US  Venous Img Lower Bilateral Result Date: 07/17/2024 CLINICAL DATA:  73 year old male with lower extremity swelling. EXAM: BILATERAL LOWER EXTREMITY VENOUS DOPPLER ULTRASOUND TECHNIQUE: Gray-scale sonography with graded compression, as well as color Doppler and duplex ultrasound were performed to evaluate the lower extremity deep venous systems from the level of the common femoral vein and including the common femoral, femoral, profunda femoral, popliteal and calf veins including the posterior tibial, peroneal and gastrocnemius veins when visible. The superficial great saphenous vein was also interrogated. Spectral Doppler was utilized to evaluate flow at rest and with distal augmentation maneuvers in the common femoral, femoral and popliteal veins. COMPARISON:  None Available. FINDINGS: RIGHT LOWER EXTREMITY Common Femoral Vein: No evidence of thrombus. Normal compressibility, respiratory phasicity and response to augmentation. Saphenofemoral Junction: No evidence of thrombus. Normal compressibility and flow on color Doppler imaging. Profunda Femoral Vein: No evidence of thrombus. Normal compressibility and flow on color Doppler imaging. Femoral Vein: No evidence of thrombus. Normal compressibility,  respiratory phasicity and response to augmentation. Popliteal Vein: No evidence of thrombus. Normal compressibility, respiratory phasicity and response to augmentation. Calf Veins: No evidence of thrombus. Normal compressibility and flow on color Doppler imaging. Other Findings:  None. LEFT LOWER EXTREMITY Common Femoral Vein: No evidence of thrombus. Normal compressibility, respiratory phasicity and response to augmentation. Saphenofemoral Junction: No evidence of thrombus. Normal compressibility and flow on color Doppler imaging. Profunda Femoral Vein: No evidence of thrombus. Normal compressibility and flow on color Doppler imaging. Femoral Vein: No evidence of thrombus. Normal compressibility, respiratory phasicity and response to augmentation. Popliteal Vein: No evidence of thrombus. Normal compressibility, respiratory phasicity and response to augmentation. Calf Veins: No evidence of thrombus. Normal compressibility and flow on color Doppler imaging. Other Findings:  None. IMPRESSION: No evidence of bilateral lower extremity deep venous thrombosis. Ester Sides, MD [facility] Rad[facility] Electronically Signed   By: Ester Sides M.D.   On: 07/17/2024 16:08   CT Angio Chest PE W and/or Wo Contrast Result Date: 07/17/2024 CLINICAL DATA:  Pulmonary embolism suspected. Difficulty breathing, chest pain. EXAM: CT ANGIOGRAPHY CHEST WITH CONTRAST TECHNIQUE: Multidetector CT imaging of the chest was performed using the standard protocol during bolus administration of intravenous contrast. Multiplanar CT image reconstructions and MIPs were obtained to evaluate the vascular anatomy. RADIATION DOSE REDUCTION: This exam was performed according to the departmental dose-optimization program which includes automated exposure control, adjustment of the mA and/or kV according to patient size and/or use of iterative reconstruction technique. CONTRAST:  80mL OMNIPAQUE IOHEXOL  350 MG/ML SOLN COMPARISON:  None Available. FINDINGS: Cardiovascular: Negative for pulmonary embolus. Atherosclerotic calcification of the aorta, aortic valve and coronary arteries. Heart is enlarged. Small pericardial effusion. Mediastinum/Nodes: Thoracic inlet lymph nodes are not enlarged by CT size criteria. No pathologically enlarged mediastinal, hilar or axillary lymph nodes. Esophagus is grossly unremarkable. Lungs/Pleura: Image quality is degraded by expiratory phase imaging, creating added density in the lungs. Dependent atelectasis. Moderate bilateral pleural effusions. Calcified granulomas. Airway is unremarkable. Upper Abdomen: Gallstone. Low-attenuation lesion in the right kidney, incompletely visualized. No specific follow-up necessary. Probable renal vascular calcification  on the right. Visualized portions of the liver, gallbladder, adrenal glands, kidneys, spleen, pancreas, stomach and bowel are otherwise grossly unremarkable. No upper abdominal adenopathy. Musculoskeletal: Degenerative changes in the spine.  Kyphosis. Review of the MIP images confirms the above findings. IMPRESSION: 1. Negative for pulmonary embolus. 2. Moderate bilateral pleural effusions. 3. Small pericardial effusion. 4. Cholelithiasis. 5. Aortic atherosclerosis (ICD10-I70.0). Coronary artery calcification. Electronically Signed   By: Newell Eke M.D.   On: 07/17/2024 13:46   DG Chest 2 View Result Date: 07/17/2024 EXAM: 2 VIEW(S) XRAY OF THE CHEST 07/17/2024 11:37:00 AM COMPARISON: None available. CLINICAL HISTORY: Mccance of breath FINDINGS: LUNGS AND PLEURA: Right basilar opacities. Blunting of bilateral costophrenic angles, right greater than left. No pulmonary edema. No pneumothorax. HEART AND MEDIASTINUM: No acute abnormality of the cardiac and mediastinal silhouettes. BONES AND SOFT TISSUES: No acute osseous abnormality. IMPRESSION: 1. Right basilar opacities. 2. Blunting of bilateral costophrenic angles, right  greater than left. Electronically signed by: Norleen Boxer MD 07/17/2024 12:21 PM EST RP Workstation: [id number]     Assessment and Plan: Unstable Angina  Currently, chest pain free.  Echocardiogram pending.   Patient has reported 6 months of classic anginal symptoms, and while his troponin could be demand ischemia secondary to hypertensive urgency, given his risk factors of hypertension, diabetes [A1c 9.6%], coronary calcifications on CT and history of tobacco use would benefit from an ischemic evaluation. Will discuss cardiac catheterization with MD.  Continue IV heparin Continue ASA 81 Continue IV nitroglycerin for hypertension below  Hypertensive Urgency  BP: 177/79, highest 227/200 yesterday PTA medications fosinopril and hydrochlorothiazide -continue to hold both medications  Continue IV nitroglycerin, at 36mL/hr currently Has not been 48 hours since last ACEi administration, will hold on ARB Will hold on starting BB until more compensated  Start spironolactone 25 mg  Suspected HF Presenting with fatigue, progressive shortness of breath, orthopnea, and PND Imaging concerning for volume overload proBNP 2857 Received IV diuresis yesterday, UOP Echocardiogram pending On exam appears, volume up  Will follow up on EF with echo to guide medical management.  Would not restart PTA ACEi this admission at this time. Will hold on starting BB until more compensated   Agree with IV Lasix 40 mg, patient reported good UOP with dose at [facility], will reassess in am Start spironolactone 25 mg   Atrial fibrillation, with episode of RVR Currently in, sinus rhythm Chad Vasc score 4, though pending work-up for HF which if confirmed would make 5  At this time patient has no prior A-fib history, and A-fib episode yesterday was brief.  Will continue to assess burden on telemetry, though given his Chad Vasc score would most likely benefit from chronic anticoagulation.  Continue IV  heparin Will initiate BB once more compensated  Hyperlipidemia On chart review lipid panel 06/2024 shows LDL 37   HDL 51 Continue Zocor 20 mg  Per primary Type 1 diabetes CKD Chronic anemia Depression Insomnia Celiac disease GERD COPD   Risk Assessment/Risk Scores:    TIMI Risk Score for Unstable Angina or Non-ST Elevation MI:   The patient's TIMI risk score is 5, which indicates a 26% risk of all cause mortality, new or recurrent myocardial infarction or need for urgent revascularization in the next 14 days.  New York  Heart Association (NYHA) Functional Class NYHA Class III  CHA2DS2-VASc Score = 4 For now, though most likely will be 5  This indicates a 4.8% annual risk of stroke. The patient's score is based upon: CHF  History: 0 HTN History: 1 Diabetes History: 1 Stroke History: 0 Vascular Disease History: 1 Age Score: 1 Gender Score: 0        For questions or updates, please contact [facility] Please consult [email or web address] for contact info under      Signed, Leontine LOISE Salen, PA-C  07/18/2024 1:19 PM  Norleen LELON Garner was seen by me today along with Leontine Salen, PA-C. I have personally performed an evaluation on this patient.  My findings are as follows:  73 y.o. male with history of HTN, HLD, DM, COPD, former tobacco abuse admitted with chest pain, dyspnea and uncontrolled HTN. He was found to have mild elevation of troponin and volume overload with pleural effusions noted on CT scan. He has been started on diuresis with IV Lasix. He did have a brief run of atrial fib while being observed in the ED. He is in sinus today.  He has been placed on IV heparin and IV NTG and is pain free currently.   Data: EKG(s) and pertinent labs, studies, etc were personally reviewed and interpreted by me:  I have personally reviewed the EKG: sinus, PVCs I have personally reviewed the telemetry: sinus I have personally reviewed the lab data Otherwise, I agree  with data as outlined by the advanced practice provider.  Exam performed by me:  Gen: NAD Neck: No JVD Cardiac: RRR without a murmur Lungs: clear bilaterally Extremities: no LE edema  My Assessment and Plan:  Unstable angina with acute CHF:  Concern for ACS given his presentation although troponin is only minimally elevated. Risk factors for CAD include age, tobacco abuse, HTN, HLD and DM.  Will plan right and left heart cath tomorrow.  Echo today to assess LVEF Continue IV heparin Continue IV NTG Continue ASA and statin Add aldactone.  One dose of IV Lasix today  HTN: Continue IV NTG while awaiting cath. Add aldactone. Will further adjust medical therapy following his cath tomorrow    Signed,  Lonni Cash, MD  07/18/2024 2:38 PM

## 2024-07-18 NOTE — Progress Notes (Signed)
 Received message about patient going back into AF RVR. Upon arrival patient was back in sinus rhythm. Given length of episode and lack of evidence for low output started patient on metoprolol.

## 2024-07-18 NOTE — Plan of Care (Signed)

## 2024-07-18 NOTE — ED Notes (Signed)
 [facility] at bedside

## 2024-07-18 NOTE — ED Notes (Signed)
 Called Oscar Moses at [facility] for transport 10:54 TC

## 2024-07-18 NOTE — ED Notes (Signed)
 Pt now back in NSR, EKG captured and pt reassessed. Pt reports much improvement of symptoms, appears calm and relaxed in bed.

## 2024-07-18 NOTE — ED Notes (Signed)
 Lab called for courier for hep level

## 2024-07-18 NOTE — Inpatient Diabetes Management (Signed)
 Inpatient Diabetes Program Recommendations  AACE/ADA: New Consensus Statement on Inpatient Glycemic Control (2015)  Target Ranges:  Prepandial:   less than 140 mg/dL      Peak postprandial:   less than 180 mg/dL (1-2 hours)      Critically ill patients:  140 - 180 mg/dL   Lab Results  Component Value Date   GLUCAP 239 (H) 07/18/2024   HGBA1C 9.6 (H) 07/17/2024    Review of Glycemic Control  Latest Reference Range & Units 07/17/24 20:39 07/18/24 08:07 07/18/24 11:20 07/18/24 12:08  Glucose-Capillary 70 - 99 mg/dL 713 (H) 592 (H) 747 (H) 239 (H)  (H): Data is abnormally high  Diabetes history: DM1(does not make insulin.  Needs correction, basal and meal coverage)  Outpatient Diabetes medications:  Tresiba 12 units every day Humalog 0-20 units TID PCP Norleen Jungling  Current orders for Inpatient glycemic control:  Semglee 5 units every day Novolog 0-15 units TID and 0-5 units QHS  Inpatient Diabetes Program Recommendations:    Met with patient briefly at bedside to confirm above home insulins.  He wears a [employer] 3.  Might consider:  Semglee 8 units every day Novolog 0-9 units TID and 0-5 units at bedtime Novolog 3 units TID with meals if he consumes at least 50%  Thank you, Wyvonna Pinal, MSN, CDCES Diabetes Coordinator Inpatient Diabetes Program [phone] (team pager from 8a-5p)

## 2024-07-18 NOTE — ED Notes (Signed)
 Pt now c/o restlessness, asking to sit up on the side of the bed. Denies any worsening cp but feels anxious. Dr. Haze notified, sleeping pill to be ordered at this time.

## 2024-07-18 NOTE — ED Notes (Signed)
 Pt has rested a few hours after sleeping pill given, states he feels more rested. Cp remains at 6/10 currently.

## 2024-07-18 NOTE — ED Notes (Signed)
 Pt appears to be in Afib RVR, c/o sob and appears very anxious - states he feels he cannot take a deep breath. EKG captured and given to provider, cp remains at 7/10. RT at bedside. Awaiting further orders at this time.

## 2024-07-18 NOTE — H&P (Signed)
 History and Physical   Oscar Moses FMW:[id number] DOB: 1951/04/01 DOA: 07/17/2024  PCP: Onita Norleen, MD   Patient coming from: Home  Chief Complaint: Shortness of breath  HPI: Oscar Moses is a 73 y.o. male with medical history significant of hypertension, hyperlipidemia, CKD 2, GERD, diabetes, depression, celiac disease, COPD, anemia, low back pain presenting with shortness of breath.  Patient reports 1 month of shortness of breath.  Also reports cough with whitish sputum and intermittent episodes of lightheadedness. Has had some exertional chest pain for months, worse for the past week or so.  Reports lower extremity edema right greater than left.  Denies fevers, chills, abdominal pain, constipation, diarrhea, nausea, vomiting.  ED Course: Vital signs in the ED notable for blood pressure in the 120s-220 systolic, heart rate in the 70s-130s.  Requiring 3 L to maintain saturations.  Lab workup included BMP with glucose 139.  LFTs with AST 46.  CBC with hemoglobin 10.6 which is down from baseline closer to 13.  proBNP elevated to 2857 and troponin flat at 31, 26.  A1c 9.6.  Chest x-ray with right basilar changes and bilateral angle blunting.  CT PE study was negative for PE but did show moderate bilateral effusions as well as a small pericardial effusion.  DVT study negative.  Patient received Tylenol, aspirin, morphine, Lasix, labetalol, metoprolol, nitro infusion, heparin infusion, trazodone, sliding scale insulin in the ED.  Cardiology was consulted recommended continue with infusions as well as the above Lasix with plan to consult on arrival.    Review of Systems: As per HPI otherwise all other systems reviewed and are negative.  Past Medical History:  Diagnosis Date   Diabetes mellitus without complication (HCC)    History of severe acute respiratory syndrome coronavirus 2 (SARS-CoV-2) disease 10/09/2019    Past Surgical History:  Procedure Laterality Date   BACK SURGERY       Social History  reports that he has quit smoking. His smoking use included cigarettes. He has never used smokeless tobacco. He reports that he does not currently use alcohol. He reports that he does not use drugs.  No Known Allergies  History reviewed. No pertinent family history.  Prior to Admission medications   Medication Sig Start Date End Date Taking? Authorizing Provider  aspirin EC 81 MG tablet Take 81 mg by mouth daily. 11/08/13  Yes [provider]  Continuous Glucose Sensor (FREESTYLE LIBRE 3 SENSOR) MISC 1 each by Other route every 14 (fourteen) days. 04/07/24  Yes [provider]  fosinopril (MONOPRIL) 40 MG tablet Take 40 mg by mouth 2 (two) times daily. 09/10/13  Yes [provider]  HYDROcodone-acetaminophen (NORCO) 10-325 MG tablet Take 1 tablet by mouth 4 (four) times daily as needed.   Yes [provider]  insulin lispro (HUMALOG) 100 UNIT/ML KwikPen See admin instructions. Use per sliding scale three to four times daily as directed. 11/13/19  Yes [provider]  simvastatin (ZOCOR) 20 MG tablet Take 20 mg by mouth every morning. 09/30/13  Yes [provider]  traZODone (DESYREL) 50 MG tablet Take 75 mg by mouth at bedtime as needed. 02/11/24  Yes [provider]  TRESIBA FLEXTOUCH 100 UNIT/ML FlexTouch Pen Inject 12 Units into the skin at bedtime. 11/13/19  Yes [provider]    Physical Exam: Vitals:   07/18/24 0945 07/18/24 1000 07/18/24 1015 07/18/24 1225  BP: 125/66 134/68 (!) 144/65 (!) 177/79  Pulse: 70 73 78 77  Resp: (!) 23 (!)  23 16 16   Temp:    97.9 F (36.6 C)  TempSrc:    Oral  SpO2: 94% 95% 95% 97%  Weight:    71.2 kg  Height:    5' 9 (1.753 m)    Physical Exam Constitutional:      General: He is not in acute distress.    Appearance: Normal appearance.  HENT:     Head: Normocephalic and atraumatic.     Mouth/Throat:     Mouth: Mucous membranes are moist.     Pharynx: Oropharynx  is clear.  Eyes:     Extraocular Movements: Extraocular movements intact.     Pupils: Pupils are equal, round, and reactive to light.  Cardiovascular:     Rate and Rhythm: Normal rate and regular rhythm.     Pulses: Normal pulses.     Heart sounds: Normal heart sounds.  Pulmonary:     Effort: Pulmonary effort is normal. No respiratory distress.     Breath sounds: Normal breath sounds.  Abdominal:     General: Bowel sounds are normal. There is no distension.     Palpations: Abdomen is soft.     Tenderness: There is no abdominal tenderness.  Musculoskeletal:        General: No swelling or deformity.  Skin:    General: Skin is warm and dry.  Neurological:     General: No focal deficit present.     Mental Status: Mental status is at baseline.    Labs on Admission: I have personally reviewed following labs and imaging studies  CBC: Recent Labs  Lab 07/17/24 1129 07/17/24 1442  WBC 6.7 6.5  HGB 10.6* 10.1*  HCT 31.9* 29.8*  MCV 97.6 96.1  PLT 322 257    Basic Metabolic Panel: Recent Labs  Lab 07/17/24 1129 07/17/24 1442  NA 141  --   K 4.5  --   CL 103  --   CO2 27  --   GLUCOSE 139*  --   BUN 18  --   CREATININE 1.02 1.00  CALCIUM 9.5  --     GFR: Estimated Creatinine Clearance: 65.8 mL/min (by C-G formula based on SCr of 1 mg/dL).  Liver Function Tests: Recent Labs  Lab 07/17/24 1443  AST 46*  ALT 33  ALKPHOS 102  BILITOT 0.5  PROT 6.9  ALBUMIN 3.6    Urine analysis:    Component Value Date/Time   COLORURINE YELLOW 01/01/2023 1834   APPEARANCEUR CLEAR 01/01/2023 1834   LABSPEC 1.020 01/01/2023 1834   PHURINE 5.5 01/01/2023 1834   GLUCOSEU 500 (A) 01/01/2023 1834   HGBUR NEGATIVE 01/01/2023 1834   BILIRUBINUR NEGATIVE 01/01/2023 1834   KETONESUR NEGATIVE 01/01/2023 1834   PROTEINUR 100 (A) 01/01/2023 1834   NITRITE NEGATIVE 01/01/2023 1834   LEUKOCYTESUR NEGATIVE 01/01/2023 1834    Radiological Exams on Admission: US  Venous Img Lower  Bilateral Result Date: 07/17/2024 CLINICAL DATA:  73 year old male with lower extremity swelling. EXAM: BILATERAL LOWER EXTREMITY VENOUS DOPPLER ULTRASOUND TECHNIQUE: Gray-scale sonography with graded compression, as well as color Doppler and duplex ultrasound were performed to evaluate the lower extremity deep venous systems from the level of the common femoral vein and including the common femoral, femoral, profunda femoral, popliteal and calf veins including the posterior tibial, peroneal and gastrocnemius veins when visible. The superficial great saphenous vein was also interrogated. Spectral Doppler was utilized to evaluate flow at rest and with distal augmentation maneuvers in the common femoral, femoral and popliteal veins.  COMPARISON:  None Available. FINDINGS: RIGHT LOWER EXTREMITY Common Femoral Vein: No evidence of thrombus. Normal compressibility, respiratory phasicity and response to augmentation. Saphenofemoral Junction: No evidence of thrombus. Normal compressibility and flow on color Doppler imaging. Profunda Femoral Vein: No evidence of thrombus. Normal compressibility and flow on color Doppler imaging. Femoral Vein: No evidence of thrombus. Normal compressibility, respiratory phasicity and response to augmentation. Popliteal Vein: No evidence of thrombus. Normal compressibility, respiratory phasicity and response to augmentation. Calf Veins: No evidence of thrombus. Normal compressibility and flow on color Doppler imaging. Other Findings:  None. LEFT LOWER EXTREMITY Common Femoral Vein: No evidence of thrombus. Normal compressibility, respiratory phasicity and response to augmentation. Saphenofemoral Junction: No evidence of thrombus. Normal compressibility and flow on color Doppler imaging. Profunda Femoral Vein: No evidence of thrombus. Normal compressibility and flow on color Doppler imaging. Femoral Vein: No evidence of thrombus. Normal compressibility, respiratory phasicity and response to  augmentation. Popliteal Vein: No evidence of thrombus. Normal compressibility, respiratory phasicity and response to augmentation. Calf Veins: No evidence of thrombus. Normal compressibility and flow on color Doppler imaging. Other Findings:  None. IMPRESSION: No evidence of bilateral lower extremity deep venous thrombosis. Ester Sides, MD [facility] Electronically Signed   By: Ester Sides M.D.   On: 07/17/2024 16:08   CT Angio Chest PE W and/or Wo Contrast Result Date: 07/17/2024 CLINICAL DATA:  Pulmonary embolism suspected. Difficulty breathing, chest pain. EXAM: CT ANGIOGRAPHY CHEST WITH CONTRAST TECHNIQUE: Multidetector CT imaging of the chest was performed using the standard protocol during bolus administration of intravenous contrast. Multiplanar CT image reconstructions and MIPs were obtained to evaluate the vascular anatomy. RADIATION DOSE REDUCTION: This exam was performed according to the departmental dose-optimization program which includes automated exposure control, adjustment of the mA and/or kV according to patient size and/or use of iterative reconstruction technique. CONTRAST:  80mL OMNIPAQUE IOHEXOL 350 MG/ML SOLN COMPARISON:  None Available. FINDINGS: Cardiovascular: Negative for pulmonary embolus. Atherosclerotic calcification of the aorta, aortic valve and coronary arteries. Heart is enlarged. Small pericardial effusion. Mediastinum/Nodes: Thoracic inlet lymph nodes are not enlarged by CT size criteria. No pathologically enlarged mediastinal, hilar or axillary lymph nodes. Esophagus is grossly unremarkable. Lungs/Pleura: Image quality is degraded by expiratory phase imaging, creating added density in the lungs. Dependent atelectasis. Moderate bilateral pleural effusions. Calcified granulomas. Airway is unremarkable. Upper Abdomen: Gallstone. Low-attenuation lesion in the right kidney, incompletely visualized. No specific  follow-up necessary. Probable renal vascular calcification on the right. Visualized portions of the liver, gallbladder, adrenal glands, kidneys, spleen, pancreas, stomach and bowel are otherwise grossly unremarkable. No upper abdominal adenopathy. Musculoskeletal: Degenerative changes in the spine.  Kyphosis. Review of the MIP images confirms the above findings. IMPRESSION: 1. Negative for pulmonary embolus. 2. Moderate bilateral pleural effusions. 3. Small pericardial effusion. 4. Cholelithiasis. 5. Aortic atherosclerosis (ICD10-I70.0). Coronary artery calcification. Electronically Signed   By: Newell Eke M.D.   On: 07/17/2024 13:46   DG Chest 2 View Result Date: 07/17/2024 EXAM: 2 VIEW(S) XRAY OF THE CHEST 07/17/2024 11:37:00 AM COMPARISON: None available. CLINICAL HISTORY: Railsback of breath FINDINGS: LUNGS AND PLEURA: Right basilar opacities. Blunting of bilateral costophrenic angles, right greater than left. No pulmonary edema. No pneumothorax. HEART AND MEDIASTINUM: No acute abnormality of the cardiac and mediastinal silhouettes. BONES AND SOFT TISSUES: No acute osseous abnormality. IMPRESSION: 1. Right basilar opacities. 2. Blunting of bilateral costophrenic angles, right greater than left. Electronically signed by: Norleen Boxer MD 07/17/2024 12:21 PM EST RP  Workstation: [id number]   EKG: Independently reviewed.  Sinus rhythm at 72 bpm.  Nonspecific T wave changes.  Minimal baseline wander.  PVC noted.  QTc prolonged at 504.  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  Found to have proBNP of 2857.  Troponin flat at 31, 26; But has anginal pain. > Requiring 3 L in the ED.  Started on Lasix and nitro drip/heparin drip initially. > Cardiology consulted from the ED and will follow. - Monitoring on progressive unit - Appreciate cardiology recommendations and assistance - Continue with Lasix 40 mg IV twice daily - Strict I's and O's, daily weights - Echocardiogram - Check magnesium - Trend renal function at electrolytes - Continue heparin and nitroglycerin  Prolonged QTc - Repeat EKG tomorrow - Avoid QTc prolonging medications  Hypertension ?Hypertension emergency > Blood pressure as high as the 220s systolic in the ED with new onset CHF and demand ischemia. > On nitroglycerin infusion - Continue nitro infusion - Lasix as above - Holding ACE inhibitor as regimen may need to be adjusted pending results of CHF workup.  Atrial fibrillation > Episode of new onset atrial fibrillation in the ED on ekg.  Recent EKG was sinus.  On the floor tele looks to sinus with PACs. - Continue with heparin infusion - Echocardiogram - Likely to start Metop pending Echo/Ischemic eval  Hyperlipidemia - Continue home simvastatin  CKD 2 > Creatinine stable in the ED - Continue home ACE inhibitor - Trend renal function and electrolytes  Diabetes > On chronic insulin - 5 units daily - SSI  Celiac disease - Noted  COPD - Noted, not on inhalers  Anemia > Hemoglobin 10.6, baseline 13.  Possibly delusional component - Continue to trend CBC  DVT prophylaxis: Heparin for now Code Status:   Full Family Communication:  Dtr updated at bedside. Reports patients spouse has dementia and is not to be given updates/cannot make decisions.   Disposition Plan:   Patient is from:  Home  Anticipated DC to:  Home  Anticipated DC  date:  2 to 3 days  Anticipated DC barriers: None  Consults called:  Cardiology Admission status:  Inpatient, progressive  Severity of Illness: The appropriate patient status for this patient is INPATIENT. Inpatient status is judged to be reasonable and necessary in order to provide the required intensity of service to ensure the patient's safety. The patient's presenting symptoms, physical exam findings, and initial radiographic and laboratory data in the context of their chronic comorbidities is felt to place them at high risk for further clinical deterioration. Furthermore, it is not anticipated that the patient will be medically stable for discharge from the hospital within 2 midnights of admission.   * I certify that at the point of admission it is my clinical judgment that the patient will require inpatient hospital care spanning beyond 2 midnights from the point of admission due to high intensity of service, high risk for further deterioration and high frequency of surveillance required.*  Marsa KATHEE Scurry MD [facility]  How to contact the [facility] Attending or Consulting provider 7A - 7P or covering provider during after hours 7P -7A, for this patient?   Check the care team in [facility] and look for a) attending/consulting [facility] provider listed and b) the [facility] team listed Log into [email or web address] and use [facility] universal password to access. If you do not have the password, please contact the hospital operator. Locate the [facility] provider you are looking for under [facility] and page to a number that you can be directly reached. If you still have difficulty reaching the provider, please page the [facility] (Director on Call) for the Hospitalists listed on amion for assistance.  07/18/2024, 1:45 PM

## 2024-07-18 NOTE — ED Notes (Signed)
 Patient ranging saying his phone alerted him of his blood glucose being high. Confirmed with our glucometer and it was 407. This is outside the parameter so the ED MD was consulted. He said he feels comfortable doing 15 units and rechecking the glucose. 15 units administered SQ.

## 2024-07-19 ENCOUNTER — Inpatient Hospital Stay (HOSPITAL_COMMUNITY)

## 2024-07-19 ENCOUNTER — Telehealth (HOSPITAL_COMMUNITY): Payer: Self-pay

## 2024-07-19 ENCOUNTER — Encounter (HOSPITAL_COMMUNITY)
Admission: EM | Disposition: A | Discharge status: Home / Self Care | Payer: Self-pay | Source: Ambulatory Visit | Attending: Family Medicine

## 2024-07-19 ENCOUNTER — Other Ambulatory Visit (HOSPITAL_COMMUNITY): Payer: Self-pay

## 2024-07-19 DIAGNOSIS — I200 Unstable angina: Secondary | ICD-10-CM | POA: Diagnosis not present

## 2024-07-19 DIAGNOSIS — I214 Non-ST elevation (NSTEMI) myocardial infarction: Secondary | ICD-10-CM | POA: Diagnosis not present

## 2024-07-19 DIAGNOSIS — I480 Paroxysmal atrial fibrillation: Secondary | ICD-10-CM

## 2024-07-19 DIAGNOSIS — I509 Heart failure, unspecified: Secondary | ICD-10-CM | POA: Diagnosis not present

## 2024-07-19 LAB — COMPREHENSIVE METABOLIC PANEL WITH GFR
ALT: 21 U/L (ref 0–44)
AST: 21 U/L (ref 15–41)
Albumin: 2.4 g/dL — ABNORMAL LOW (ref 3.5–5.0)
Alkaline Phosphatase: 60 U/L (ref 38–126)
Anion gap: 12 (ref 5–15)
BUN: 23 mg/dL (ref 8–23)
CO2: 24 mmol/L (ref 22–32)
Calcium: 7.7 mg/dL — ABNORMAL LOW (ref 8.9–10.3)
Chloride: 98 mmol/L (ref 98–111)
Creatinine, Ser: 2.24 mg/dL — ABNORMAL HIGH (ref 0.61–1.24)
GFR, Estimated: 30 mL/min — ABNORMAL LOW (ref >60)
Glucose, Bld: 432 mg/dL — ABNORMAL HIGH (ref 70–99)
Potassium: 4.6 mmol/L (ref 3.5–5.1)
Sodium: 134 mmol/L — ABNORMAL LOW (ref 135–145)
Total Bilirubin: 0.7 mg/dL (ref 0.0–1.2)
Total Protein: 5.3 g/dL — ABNORMAL LOW (ref 6.5–8.1)

## 2024-07-19 LAB — CBC
HCT: 23.7 % — ABNORMAL LOW (ref 39.0–52.0)
Hemoglobin: 8 g/dL — ABNORMAL LOW (ref 13.0–17.0)
MCH: 32.5 pg (ref 26.0–34.0)
MCHC: 33.8 g/dL (ref 30.0–36.0)
MCV: 96.3 fL (ref 80.0–100.0)
Platelets: 238 K/uL (ref 150–400)
RBC: 2.46 MIL/uL — ABNORMAL LOW (ref 4.22–5.81)
RDW: 12.2 % (ref 11.5–15.5)
WBC: 5.8 K/uL (ref 4.0–10.5)
nRBC: 0 % (ref 0.0–0.2)

## 2024-07-19 LAB — LIPID PANEL
Cholesterol: 98 mg/dL (ref 0–200)
HDL: 38 mg/dL — ABNORMAL LOW (ref >40)
LDL Cholesterol: 31 mg/dL (ref 0–99)
Total CHOL/HDL Ratio: 2.6 ratio
Triglycerides: 147 mg/dL (ref <150)
VLDL: 29 mg/dL (ref 0–40)

## 2024-07-19 LAB — HEPARIN LEVEL (UNFRACTIONATED): Heparin Unfractionated: 0.25 [IU]/mL — ABNORMAL LOW (ref 0.30–0.70)

## 2024-07-19 LAB — RETICULOCYTES
Immature Retic Fract: 9.8 % (ref 2.3–15.9)
RBC.: 2.55 MIL/uL — ABNORMAL LOW (ref 4.22–5.81)
Retic Count, Absolute: 45.9 K/uL (ref 19.0–186.0)
Retic Ct Pct: 1.8 % (ref 0.4–3.1)

## 2024-07-19 LAB — ECHOCARDIOGRAM COMPLETE
Area-P 1/2: 2.39 cm2
Height: 69 in
S' Lateral: 3.6 cm
Weight: 2520 [oz_av]

## 2024-07-19 LAB — VITAMIN B12: Vitamin B-12: 2238 pg/mL — ABNORMAL HIGH (ref 180–914)

## 2024-07-19 LAB — LACTIC ACID, PLASMA
Lactic Acid, Venous: 3.2 mmol/L (ref 0.5–1.9)
Lactic Acid, Venous: 1.8 mmol/L (ref 0.5–1.9)

## 2024-07-19 LAB — HEMOGLOBIN AND HEMATOCRIT, BLOOD
HCT: 22.4 % — ABNORMAL LOW (ref 39.0–52.0)
Hemoglobin: 7.7 g/dL — ABNORMAL LOW (ref 13.0–17.0)

## 2024-07-19 LAB — IRON AND TIBC
Iron: 56 ug/dL (ref 45–182)
Saturation Ratios: 21 % (ref 17.9–39.5)
TIBC: 262 ug/dL (ref 250–450)
UIBC: 206 ug/dL

## 2024-07-19 LAB — FOLATE: Folate: >20 ng/mL (ref >5.9)

## 2024-07-19 LAB — GLUCOSE, CAPILLARY
Glucose-Capillary: 477 mg/dL — ABNORMAL HIGH (ref 70–99)
Glucose-Capillary: 478 mg/dL — ABNORMAL HIGH (ref 70–99)
Glucose-Capillary: 324 mg/dL — ABNORMAL HIGH (ref 70–99)
Glucose-Capillary: 161 mg/dL — ABNORMAL HIGH (ref 70–99)
Glucose-Capillary: 116 mg/dL — ABNORMAL HIGH (ref 70–99)
Glucose-Capillary: 337 mg/dL — ABNORMAL HIGH (ref 70–99)

## 2024-07-19 LAB — FERRITIN: Ferritin: 65 ng/mL (ref 24–336)

## 2024-07-19 LAB — TSH: TSH: 1.144 u[IU]/mL (ref 0.350–4.500)

## 2024-07-19 SURGERY — RIGHT/LEFT HEART CATH AND CORONARY ANGIOGRAPHY
Anesthesia: LOCAL

## 2024-07-19 MED ORDER — ASPIRIN 81 MG PO CHEW: 81.0000 mg | CHEWABLE_TABLET | ORAL | Status: DC

## 2024-07-19 MED ORDER — INSULIN ASPART 100 UNIT/ML IJ SOLN
10.0000 [IU] | Freq: Once | INTRAMUSCULAR | Status: AC
  Administered 2024-07-19: 10 [IU] via SUBCUTANEOUS
  Filled 2024-07-19: qty 10

## 2024-07-19 MED ORDER — HYDROCODONE-ACETAMINOPHEN 10-325 MG PO TABS
1.0000 | ORAL_TABLET | Freq: Four times a day (QID) | ORAL | Status: DC | PRN
  Administered 2024-07-19 – 2024-07-21 (×7): 1 via ORAL
  Filled 2024-07-19 (×7): qty 1

## 2024-07-19 MED ORDER — FREE WATER: 250.0000 mL | Freq: Once | Status: DC

## 2024-07-19 MED ORDER — HYDRALAZINE HCL 20 MG/ML IJ SOLN: 10.0000 mg | INTRAMUSCULAR | Status: DC | PRN

## 2024-07-19 MED ORDER — GLUCAGON HCL RDNA (DIAGNOSTIC) 1 MG IJ SOLR: 1.0000 mg | INTRAMUSCULAR | Status: DC | PRN

## 2024-07-19 MED ORDER — IPRATROPIUM-ALBUTEROL 0.5-2.5 (3) MG/3ML IN SOLN: 3.0000 mL | RESPIRATORY_TRACT | Status: DC | PRN

## 2024-07-19 MED ORDER — INSULIN ASPART 100 UNIT/ML IJ SOLN
0.0000 [IU] | INTRAMUSCULAR | Status: DC
  Administered 2024-07-19: 11 [IU] via SUBCUTANEOUS
  Administered 2024-07-20: 2 [IU] via SUBCUTANEOUS
  Administered 2024-07-20: 8 [IU] via SUBCUTANEOUS
  Administered 2024-07-20: 3 [IU] via SUBCUTANEOUS
  Administered 2024-07-20: 5 [IU] via SUBCUTANEOUS
  Administered 2024-07-20: 11 [IU] via SUBCUTANEOUS
  Administered 2024-07-21: 5 [IU] via SUBCUTANEOUS
  Administered 2024-07-21: 11 [IU] via SUBCUTANEOUS
  Administered 2024-07-22: 15 [IU] via SUBCUTANEOUS
  Administered 2024-07-22: 5 [IU] via SUBCUTANEOUS
  Administered 2024-07-22: 3 [IU] via SUBCUTANEOUS
  Administered 2024-07-22: 8 [IU] via SUBCUTANEOUS
  Administered 2024-07-23 (×2): 2 [IU] via SUBCUTANEOUS
  Administered 2024-07-23 (×2): 8 [IU] via SUBCUTANEOUS
  Administered 2024-07-23: 3 [IU] via SUBCUTANEOUS
  Administered 2024-07-24: 11 [IU] via SUBCUTANEOUS
  Administered 2024-07-24 (×2): 2 [IU] via SUBCUTANEOUS
  Administered 2024-07-24 (×2): 3 [IU] via SUBCUTANEOUS
  Administered 2024-07-25: 5 [IU] via SUBCUTANEOUS
  Administered 2024-07-25: 2 [IU] via SUBCUTANEOUS
  Administered 2024-07-25: 5 [IU] via SUBCUTANEOUS
  Filled 2024-07-19 (×2): qty 5
  Filled 2024-07-19: qty 3
  Filled 2024-07-19: qty 15
  Filled 2024-07-19: qty 8
  Filled 2024-07-19 (×2): qty 3
  Filled 2024-07-19: qty 8
  Filled 2024-07-19: qty 11
  Filled 2024-07-19: qty 5
  Filled 2024-07-19 (×2): qty 8
  Filled 2024-07-19: qty 11
  Filled 2024-07-19: qty 2
  Filled 2024-07-19: qty 11
  Filled 2024-07-19: qty 4
  Filled 2024-07-19: qty 5
  Filled 2024-07-19: qty 11
  Filled 2024-07-19 (×4): qty 2
  Filled 2024-07-19: qty 3
  Filled 2024-07-19: qty 8
  Filled 2024-07-19: qty 2
  Filled 2024-07-19: qty 8
  Filled 2024-07-19: qty 3

## 2024-07-19 MED ORDER — HYDROCODONE-ACETAMINOPHEN 10-325 MG PO TABS
1.0000 | ORAL_TABLET | Freq: Four times a day (QID) | ORAL | Status: DC | PRN
Start: 2024-07-19 — End: 2024-07-19
  Administered 2024-07-19: 1 via ORAL
  Filled 2024-07-19: qty 1

## 2024-07-19 MED ORDER — FREE WATER
250.0000 mL | Freq: Once | Status: AC
  Administered 2024-07-19: 250 mL via ORAL

## 2024-07-19 MED ORDER — INSULIN GLARGINE-YFGN 100 UNIT/ML ~~LOC~~ SOLN
12.0000 [IU] | Freq: Every day | SUBCUTANEOUS | Status: DC
  Administered 2024-07-19 – 2024-07-23 (×5): 12 [IU] via SUBCUTANEOUS
  Filled 2024-07-19 (×6): qty 0.12

## 2024-07-19 MED ORDER — METOPROLOL TARTRATE 5 MG/5ML IV SOLN: 5.0000 mg | INTRAVENOUS | Status: DC | PRN

## 2024-07-19 MED ORDER — INSULIN GLARGINE-YFGN 100 UNIT/ML ~~LOC~~ SOLN
8.0000 [IU] | Freq: Every day | SUBCUTANEOUS | Status: DC
  Filled 2024-07-19: qty 0.08

## 2024-07-19 MED ORDER — ASPIRIN 81 MG PO CHEW
81.0000 mg | CHEWABLE_TABLET | ORAL | Status: AC
  Administered 2024-07-19: 81 mg via ORAL
  Filled 2024-07-19: qty 1

## 2024-07-19 MED ORDER — ONDANSETRON HCL 4 MG/2ML IJ SOLN: 4.0000 mg | Freq: Four times a day (QID) | INTRAMUSCULAR | Status: DC | PRN

## 2024-07-19 MED ORDER — INSULIN ASPART 100 UNIT/ML IJ SOLN
4.0000 [IU] | Freq: Three times a day (TID) | INTRAMUSCULAR | Status: DC
  Administered 2024-07-19: 4 [IU] via SUBCUTANEOUS
  Filled 2024-07-19: qty 4

## 2024-07-19 NOTE — Hospital Course (Addendum)
 Brief Narrative:   57 male HTN, HLD, CKD 2, GERD, DM2, Depression, celiac disease, COPD, anemia, low back pain admitted for shortness of breath.  Upon admission noted to have signs of volume overload with moderate bilateral pleural effusion/small pericardial effusion.  No PE seen on the CTA.  Cardiology was consulted and cautiously diuresing due to AKI. Due to persistent anemia, GI team consulted for their input as patient will need to be on anticoagulation.  Assessment & Plan:    Unstable angina Congestive heart failure Acute respiratory failure with hypoxia Patient has been seen by cardiology team.  Overall troponins are flat but there is concerns of ACS.  Plans for RHC/LHC but may have to hold off today due to AKI - Echocardiogram. -A1c 9.6, LDL 31  Anemia - Admission hemoglobin 10.1, slowly has continued to drift down less than 8.  This morning 7.7.  Tells me occasionally he has been having dark hard stools for the past 2 months.  Reports his colonoscopy was over 5 years ago at [facility] which did not show any significant findings.  Consulted GI this morning who will plan on endoscopic evaluation once cleared by cardiac service.   Prolonged QTc -Resolved  AKI -Do not believe patient has CKD as baseline creatinine is 1.0 with normal GFR.  Admission creatinine 1.49 trending up 2.24 > 1.97. Holding aldactone.    Hypertension ?Hypertension emergency Initial systolic blood pressure greater than 200 therefore started on nitroglycerin drip.  Blood pressure has now stabilized.  Will wean this off, on Toprol-XL, Aldactone.   Atrial fibrillation, new onset Seen by cardiology, stopping anticoagulation for now   Hyperlipidemia -Statin  Insulin-dependent diabetes mellitus type 2, uncontrolled due to hyperglycemia -Sliding scale and Accu-Cheks.  Premeal and long-acting.  Adjust as necessary   Celiac disease - Noted   COPD -As needed bronchodilators     DVT prophylaxis: Hep Drip       Code Status: Full Code Family Communication:   Status is: Inpatient Ongoing management of heart issues   PT Follow up Recs:   Subjective: This morning reports to me of occasional dark stools at home for the past 2 months.  Reportedly had normal colonoscopy about 5 years ago at [facility].   Examination:  General exam: Appears calm and comfortable  Respiratory system: Clear to auscultation. Respiratory effort normal. Cardiovascular system: S1 & S2 heard, RRR. No JVD, murmurs, rubs, gallops or clicks. No pedal edema. Gastrointestinal system: Abdomen is nondistended, soft and nontender. No organomegaly or masses felt. Normal bowel sounds heard. Central nervous system: Alert and oriented. No focal neurological deficits. Extremities: Symmetric 5 x 5 power. Skin: No rashes, lesions or ulcers Psychiatry: Judgement and insight appear normal. Mood & affect appropriate.

## 2024-07-19 NOTE — Plan of Care (Signed)
   Problem: Education: Goal: Ability to describe self-care measures that may prevent or decrease complications (Diabetes Survival Skills Education) will improve Outcome: Progressing   Problem: Coping: Goal: Ability to adjust to condition or change in health will improve Outcome: Progressing   Problem: Fluid Volume: Goal: Ability to maintain a balanced intake and output will improve Outcome: Progressing

## 2024-07-19 NOTE — Progress Notes (Addendum)
 Progress Note  Patient Name: Oscar Moses Date of Encounter: 07/19/2024 [facility] Cardiologist: None   Interval Summary    Says he feels like crap this morning, weak and no energy  Vital Signs Vitals:   07/18/24 1647 07/18/24 2040 07/19/24 0024 07/19/24 0441  BP: (!) 151/84 133/67 (!) 124/55 133/83  Pulse:  80 78 83  Resp: 16 17 16 18   Temp: 98.1 F (36.7 C) 98 F (36.7 C) 98.9 F (37.2 C) 98.3 F (36.8 C)  TempSrc: Oral Oral Oral Oral  SpO2:  94% 94% 91%  Weight:    71.4 kg  Height:        Intake/Output Summary (Last 24 hours) at 07/19/2024 0742 Last data filed at 07/19/2024 0700 Gross per 24 hour  Intake 250 ml  Output 1350 ml  Net -1100 ml      07/19/2024    4:41 AM 07/18/2024   12:25 PM 07/17/2024    2:23 PM  Last 3 Weights  Weight (lbs) 157 lb 8 oz 156 lb 14.4 oz 156 lb 15.5 oz  Weight (kg) 71.442 kg 71.169 kg 71.2 kg      Telemetry/ECG   Intermittent atrial fibrillation yesterday afternoon, sinus rhythm with PVCs this morning - Personally Reviewed  Physical Exam  GEN: No acute distress.   Neck: No JVD Cardiac: RRR, no murmurs, rubs, or gallops.  Respiratory: Clear to auscultation bilaterally. GI: Soft, nontender, non-distended  MS: No edema  Assessment & Plan    73 y.o. male with a hx of hx of hypertension, hyperlipidemia, T1DM with retinopathy and nephropathy, chronic anemia, depression, COPD, celiac disease, insomnia, GERD, osteoarthritis, and hx of tobacco use who was seen 07/18/2024 for the evaluation of UA at the request of [name].   Unstable angina -- Reported intermittent episodes of angina over the past several months -- hsTn 31>>26, EKG with no acute ST/T wave abnormalities -- Started on IV heparin, aspirin, statin -- Initially planned for right and left heart cath today but creatinine increased from 1.4 to 2.24, therefore we will defer cath for now  CHF -- Presents with worsening shortness of breath over  the past several weeks -- proBNP 2857, chest x-ray with right basilar opacities -- Received IV Lasix 40 mg times once in the ED, net -1.9L -- Initially started on spironolactone, but held this morning in the setting of rising creatinine -- Echocardiogram pending -- Check lactate, hold metoprolol for now  Paroxsymal atrial fibrillation -- Developed episode of atrial fibrillation with RVR while in the ED and then recurrent episode last evening with conversion to sinus rhythm spontaneously -- Initially placed on metoprolol but with rising creatinine, concern for possible low output will hold metoprolol this morning. -- Continue IV heparin  Hypertensive urgency -- Significantly elevated in the 200s systolic initially. -- Much improved, remains on IV nitroglycerin -- Home ACE inhibitor held at this time  Hyperlipidemia -- 06/2024 shows LDL 37, HDL 51 -- Continue Zocor 20 mg  Anemia -- Baseline hemoglobin around 13, noted at 10.6 on admission, down to 8 this morning -- Denies any bleeding PTA or during this hospitalization -- check anemia panel  For questions or updates, please contact  [facility] Please consult [email or web address] for contact info under     Signed, Manuelita Rummer, NP   Norleen LELON Privett was seen by me today along with Manuelita Rummer, NP. I have personally performed an evaluation on this patient.  My findings are as follows:  73  y.o. male with history of HTN, HLD, DM, COPD, former tobacco abuse admitted with chest pain, dyspnea and uncontrolled HTN. He was found to have mild elevation of troponin and volume overload with pleural effusions noted on CT scan. He was started on IV Lasix. He did have a brief run of atrial fib while being observed in the ED but was in sinus when he was seen on 07/18/24.   Feeling weak today. No chest pain.   Data: EKG(s) and pertinent labs, studies, etc were personally reviewed and interpreted by me:  I have personally reviewed the  EKG: Sinus, PVCs I have personally reviewed the telemetry: sinus I have personally reviewed the lab data Otherwise, I agree with data as outlined by the advanced practice provider.  Exam performed by me: Gen: NAD Neck: No JVD Cardiac: RRR without a murmur Lungs: clear bilaterally Extremities: no LE edema  My Assessment and Plan:  Unstable angina: Mild elevation troponin. Chest pain on admission. Cardiac cath is delayed today due to rise in his creatinine. Lasix on hold. Continue IV heparin, ASA, statin.   PAF: Sinus today. Metoprolol held for now. Continue IV heparin  Hypertensive urgency: BP better controlled now on IV NTG. Ace-inh on hold with acute change in kidney function  Acute CHF: Elevated BNP. Pleural effusion noted on chest CT. Worsened renal function with diuresis. Lasix on hold today. Checking Lactic acid. Echo pending today  Anemia: Hgb down from 10.6 to 8. No obvious bleeding.   Signed,  Lonni Cash, MD  07/19/2024 10:11 AM

## 2024-07-19 NOTE — Progress Notes (Signed)
 Notified by CCMD and sent message sent to Dr. DeMarcus Ingram that patient converted to A-fib

## 2024-07-19 NOTE — Progress Notes (Signed)
 PROGRESS NOTE    MORGEN RITACCO  FMW:[id number] DOB: [birth year] DOA: 07/17/2024 PCP: Onita Norleen, MD    Brief Narrative:   78 male HTN, HLD, CKD 2, GERD, DM2, Depression, celiac disease, COPD, anemia, low back pain admitted for shortness of breath.  Upon admission noted to have signs of volume overload with moderate bilateral pleural effusion/small pericardial effusion.  No PE seen on the CTA.  Cardiology was consulted and started on IV diuretics.  Assessment & Plan:    Unstable angina Congestive heart failure Acute respiratory failure with hypoxia Patient has been seen by cardiology team.  Overall troponins are flat but there is concerns of ACS.  Plans for RHC/LHC but may have to hold off today due to AKI - Echocardiogram. -A1c 9.6, lipid panel   Prolonged QTc -Resolved  AKI -Do not believe patient has CKD as baseline creatinine is 1.0 with normal GFR.  Admission creatinine 1.49 trending up 2.24.  Hold off on nephrotoxic drugs   Hypertension ?Hypertension emergency Initial systolic blood pressure greater than 200 therefore started on nitroglycerin drip.  Blood pressure has now stabilized.  Will wean this off, on Toprol-XL, Aldactone.   Atrial fibrillation, new onset Started on metoprolol and heparin drip upon admission.  IV as needed. Echo ordered. TSH is normal.    Hyperlipidemia -Statin  Insulin-dependent diabetes mellitus type 2, uncontrolled due to hyperglycemia -Sliding scale and Accu-Cheks.  Premeal and long-acting.  Adjust as necessary   Celiac disease - Noted   COPD -As needed bronchodilators   Anemia -Admission hemoglobin 10.1, hemoglobin has drifted down 8.0.  No obvious signs of bleeding but will closely monitor this  DVT prophylaxis: Hep Drip      Code Status: Full Code Family Communication:   Status is: Inpatient Ongoing management of heart issues   PT Follow up Recs:   Subjective: Seen at bedside does not have any chest pain at this time.   He is hyperglycemic this morning requiring multiple units of [place] acting insulin.   Examination:  General exam: Appears calm and comfortable  Respiratory system: Clear to auscultation. Respiratory effort normal. Cardiovascular system: S1 & S2 heard, RRR. No JVD, murmurs, rubs, gallops or clicks. No pedal edema. Gastrointestinal system: Abdomen is nondistended, soft and nontender. No organomegaly or masses felt. Normal bowel sounds heard. Central nervous system: Alert and oriented. No focal neurological deficits. Extremities: Symmetric 5 x 5 power. Skin: No rashes, lesions or ulcers Psychiatry: Judgement and insight appear normal. Mood & affect appropriate.                Diet Orders (From admission, onward)     Start     Ordered   07/19/24 1124  Diet Carb Modified Fluid consistency: Thin; Room service appropriate? Yes  Diet effective now       Question Answer Comment  Diet-HS Snack? Nothing   Calorie Level Medium 1600-2000   Fluid consistency: Thin   Room service appropriate? Yes      07/19/24 1123            Objective: Vitals:   07/18/24 2040 07/19/24 0024 07/19/24 0441 07/19/24 0751  BP: 133/67 (!) 124/55 133/83 128/63  Pulse: 80 78 83   Resp: 17 16 18 17   Temp: 98 F (36.7 C) 98.9 F (37.2 C) 98.3 F (36.8 C) 98.1 F (36.7 C)  TempSrc: Oral Oral Oral Oral  SpO2: 94% 94% 91% 92%  Weight:   71.4 kg   Height:  Intake/Output Summary (Last 24 hours) at 07/19/2024 1123 Last data filed at 07/19/2024 1100 Gross per 24 hour  Intake 250 ml  Output 1650 ml  Net -1400 ml   Filed Weights   07/17/24 1423 07/18/24 1225 07/19/24 0441  Weight: 71.2 kg 71.2 kg 71.4 kg    Scheduled Meds:  aspirin EC  81 mg Oral Daily   insulin aspart  0-15 Units Subcutaneous TID WC   insulin aspart  0-5 Units Subcutaneous QHS   insulin aspart  4 Units Subcutaneous TID WC   insulin glargine-yfgn  8 Units Subcutaneous QHS   simvastatin  20 mg Oral q morning    sodium chloride  flush  3 mL Intravenous Q12H   Continuous Infusions:  heparin 1,350 Units/hr (07/19/24 0823)   nitroGLYCERIN 120 mcg/min (07/19/24 1024)    Nutritional status     Body mass index is 23.26 kg/m.  Data Reviewed:   CBC: Recent Labs  Lab 07/17/24 1129 07/17/24 1442 07/18/24 1406 07/19/24 0420  WBC 6.7 6.5 6.7 5.8  HGB 10.6* 10.1* 8.9* 8.0*  HCT 31.9* 29.8* 26.5* 23.7*  MCV 97.6 96.1 94.6 96.3  PLT 322 257 295 238   Basic Metabolic Panel: Recent Labs  Lab 07/17/24 1129 07/17/24 1442 07/18/24 1406 07/19/24 0420  NA 141  --  137 134*  K 4.5  --  3.8 4.6  CL 103  --  103 98  CO2 27  --  24 24  GLUCOSE 139*  --  151* 432*  BUN 18  --  16 23  CREATININE 1.02 1.00 1.49* 2.24*  CALCIUM 9.5  --  8.2* 7.7*  MG  --   --  1.9  --    GFR: Estimated Creatinine Clearance: 29.4 mL/min (A) (by C-G formula based on SCr of 2.24 mg/dL (H)). Liver Function Tests: Recent Labs  Lab 07/17/24 1443 07/18/24 1406 07/19/24 0420  AST 46* 27 21  ALT 33 25 21  ALKPHOS 102 60 60  BILITOT 0.5 0.5 0.7  PROT 6.9 5.9* 5.3*  ALBUMIN 3.6 2.8* 2.4*   No results for input(s): LIPASE, AMYLASE in the last 168 hours. No results for input(s): AMMONIA in the last 168 hours. Coagulation Profile: No results for input(s): INR, PROTIME in the last 168 hours. Cardiac Enzymes: No results for input(s): CKTOTAL, CKMB, CKMBINDEX, TROPONINI in the last 168 hours. BNP (last 3 results) Recent Labs    07/17/24 1129  PROBNP 2,857.0*   HbA1C: Recent Labs    07/17/24 1442  HGBA1C 9.6*   CBG: Recent Labs  Lab 07/18/24 1645 07/18/24 2110 07/19/24 0749 07/19/24 0904 07/19/24 1027  GLUCAP 178* 348* 477* 478* 324*   Lipid Profile: Recent Labs    07/19/24 0420  CHOL 98  HDL 38*  LDLCALC 31  TRIG 852  CHOLHDL 2.6   Thyroid Function Tests: Recent Labs    07/19/24 0420  TSH 1.144   Anemia Panel: Recent Labs    07/19/24 1016  RETICCTPCT 1.8    Sepsis Labs: Recent Labs  Lab 07/19/24 1016  LATICACIDVEN 3.2*    No results found for this or any previous visit (from the past 240 hours).       Radiology Studies: US  Venous Img Lower Bilateral Result Date: 07/17/2024 CLINICAL DATA:  73 year old male with lower extremity swelling. EXAM: BILATERAL LOWER EXTREMITY VENOUS DOPPLER ULTRASOUND TECHNIQUE: Gray-scale sonography with graded compression, as well as color Doppler and duplex ultrasound were performed to evaluate the lower extremity deep venous systems from the  level of the common femoral vein and including the common femoral, femoral, profunda femoral, popliteal and calf veins including the posterior tibial, peroneal and gastrocnemius veins when visible. The superficial great saphenous vein was also interrogated. Spectral Doppler was utilized to evaluate flow at rest and with distal augmentation maneuvers in the common femoral, femoral and popliteal veins. COMPARISON:  None Available. FINDINGS: RIGHT LOWER EXTREMITY Common Femoral Vein: No evidence of thrombus. Normal compressibility, respiratory phasicity and response to augmentation. Saphenofemoral Junction: No evidence of thrombus. Normal compressibility and flow on color Doppler imaging. Profunda Femoral Vein: No evidence of thrombus. Normal compressibility and flow on color Doppler imaging. Femoral Vein: No evidence of thrombus. Normal compressibility, respiratory phasicity and response to augmentation. Popliteal Vein: No evidence of thrombus. Normal compressibility, respiratory phasicity and response to augmentation. Calf Veins: No evidence of thrombus. Normal compressibility and flow on color Doppler imaging. Other Findings:  None. LEFT LOWER EXTREMITY Common Femoral Vein: No evidence of thrombus. Normal compressibility, respiratory phasicity and response to augmentation. Saphenofemoral Junction: No evidence of thrombus. Normal compressibility and flow on color Doppler imaging.  Profunda Femoral Vein: No evidence of thrombus. Normal compressibility and flow on color Doppler imaging. Femoral Vein: No evidence of thrombus. Normal compressibility, respiratory phasicity and response to augmentation. Popliteal Vein: No evidence of thrombus. Normal compressibility, respiratory phasicity and response to augmentation. Calf Veins: No evidence of thrombus. Normal compressibility and flow on color Doppler imaging. Other Findings:  None. IMPRESSION: No evidence of bilateral lower extremity deep venous thrombosis. Ester Sides, MD [facility] Radiology Electronically Signed   By: Ester Sides M.D.   On: 07/17/2024 16:08   CT Angio Chest PE W and/or Wo Contrast Result Date: 07/17/2024 CLINICAL DATA:  Pulmonary embolism suspected. Difficulty breathing, chest pain. EXAM: CT ANGIOGRAPHY CHEST WITH CONTRAST TECHNIQUE: Multidetector CT imaging of the chest was performed using the standard protocol during bolus administration of intravenous contrast. Multiplanar CT image reconstructions and MIPs were obtained to evaluate the vascular anatomy. RADIATION DOSE REDUCTION: This exam was performed according to the departmental dose-optimization program which includes automated exposure control, adjustment of the mA and/or kV according to patient size and/or use of iterative reconstruction technique. CONTRAST:  80mL OMNIPAQUE IOHEXOL 350 MG/ML SOLN COMPARISON:  None Available. FINDINGS: Cardiovascular: Negative for pulmonary embolus. Atherosclerotic calcification of the aorta, aortic valve and coronary arteries. Heart is enlarged. Small pericardial effusion. Mediastinum/Nodes: Thoracic inlet lymph nodes are not enlarged by CT size criteria. No pathologically enlarged mediastinal, hilar or axillary lymph nodes. Esophagus is grossly unremarkable. Lungs/Pleura: Image quality is degraded by expiratory phase imaging, creating added density in the lungs. Dependent  atelectasis. Moderate bilateral pleural effusions. Calcified granulomas. Airway is unremarkable. Upper Abdomen: Gallstone. Low-attenuation lesion in the right kidney, incompletely visualized. No specific follow-up necessary. Probable renal vascular calcification on the right. Visualized portions of the liver, gallbladder, adrenal glands, kidneys, spleen, pancreas, stomach and bowel are otherwise grossly unremarkable. No upper abdominal adenopathy. Musculoskeletal: Degenerative changes in the spine.  Kyphosis. Review of the MIP images confirms the above findings. IMPRESSION: 1. Negative for pulmonary embolus. 2. Moderate bilateral pleural effusions. 3. Small pericardial effusion. 4. Cholelithiasis. 5. Aortic atherosclerosis (ICD10-I70.0). Coronary artery calcification. Electronically Signed   By: Newell Eke M.D.   On: 07/17/2024 13:46   DG Chest 2 View Result Date: 07/17/2024 EXAM: 2 VIEW(S) XRAY OF THE CHEST 07/17/2024 11:37:00 AM COMPARISON: None available. CLINICAL HISTORY: Ducey of breath FINDINGS: LUNGS AND PLEURA: Right basilar opacities. Blunting of  bilateral costophrenic angles, right greater than left. No pulmonary edema. No pneumothorax. HEART AND MEDIASTINUM: No acute abnormality of the cardiac and mediastinal silhouettes. BONES AND SOFT TISSUES: No acute osseous abnormality. IMPRESSION: 1. Right basilar opacities. 2. Blunting of bilateral costophrenic angles, right greater than left. Electronically signed by: Norleen Boxer MD 07/17/2024 12:21 PM EST RP Workstation: [id number]           LOS: 1 day   Time spent= 35 mins    Burgess JAYSON Dare, MD [facility]  If 7PM-7AM, please contact night-coverage  07/19/2024, 11:23 AM

## 2024-07-19 NOTE — Progress Notes (Signed)
 Echocardiogram 2D Echocardiogram has been performed.  Oscar Moses FALCON Lark Runk RDCS 07/19/2024, 12:09 PM

## 2024-07-19 NOTE — Inpatient Diabetes Management (Signed)
 Inpatient Diabetes Program Recommendations  AACE/ADA: New Consensus Statement on Inpatient Glycemic Control (2015)  Target Ranges:  Prepandial:   less than 140 mg/dL      Peak postprandial:   less than 180 mg/dL (1-2 hours)      Critically ill patients:  140 - 180 mg/dL   Lab Results  Component Value Date   GLUCAP 161 (H) 07/19/2024   HGBA1C 9.6 (H) 07/17/2024    Review of Glycemic Control  Latest Reference Range & Units 07/18/24 21:10 07/19/24 07:49 07/19/24 09:04 07/19/24 10:27 07/19/24 12:02  Glucose-Capillary 70 - 99 mg/dL 651 (H) 522 (H) 521 (H) 324 (H) 161 (H)   Diabetes history:DM1 (does not make insulin.  Needs correction, basal and meal coverage)   Outpatient Diabetes medications:  Tresiba 12 units every day Humalog 0-20 units TID PCP Norleen Jungling Current orders for Inpatient glycemic control:  Novolog 0-15 units tid with meals and HS Semglee 8 units daily Novolog 4 units tid with meals  Inpatient Diabetes Program Recommendations:    Note history of  Type 1 DM. His home dose of Tresiba was 12 units daily. Agree with the addition of meal coverage. Consider reducing Novolog correction to very sensitive (0-6 units) but increase frequency to q 4 hours so that CBG's will be checked and covered throughout the night. May also consider increasing Semglee to 10 units daily.   Thanks,  Randall Bullocks, RN, BC-ADM Inpatient Diabetes Coordinator Pager [phone]  (8a-5p)

## 2024-07-19 NOTE — Progress Notes (Signed)
 PHARMACY - ANTICOAGULATION CONSULT NOTE  Pharmacy Consult for heparin Indication: chest pain/ACS  No Known Allergies  Patient Measurements: Height: 5' 9 (175.3 cm) Weight: 71.4 kg (157 lb 8 oz) IBW/kg (Calculated) : 70.7 HEPARIN DW (KG): 71.2  Vital Signs: Temp: 98.1 F (36.7 C) (11/12 0751) Temp Source: Oral (11/12 0751) BP: 128/63 (11/12 0751) Pulse Rate: 83 (11/12 0441)  Labs: Recent Labs    07/17/24 1442 07/18/24 0016 07/18/24 1000 07/18/24 1406 07/19/24 0420  HGB 10.1*  --   --  8.9* 8.0*  HCT 29.8*  --   --  26.5* 23.7*  PLT 257  --   --  295 238  HEPARINUNFRC  --  0.19* 0.30  --  0.25*  CREATININE 1.00  --   --  1.49* 2.24*    Estimated Creatinine Clearance: 29.4 mL/min (A) (by C-G formula based on SCr of 2.24 mg/dL (H)).  Medications:  Infusions:   heparin 1,200 Units/hr (07/18/24 1206)   nitroGLYCERIN 120 mcg/min (07/19/24 0338)    Assessment: 73 yom presented to the ED with ShOB. Troponin mildly elevated and now starting IV heparin. Baseline Hgb is slightly low at 10.6 but platelets are WNL. He is not on anticoagulation PTA.   Heparin level subtherapeutic, CBC ok, possible cath today.  Goal of Therapy:  Heparin level 0.3-0.7 units/ml Monitor platelets by anticoagulation protocol: Yes   Plan:  Increase heparin gtt to 1350 units/h F/U AC plans post/op Daily heparin level and CBC   Oscar Moses, PharmD, BCPS, [facility] Clinical Pharmacist [phone] Please check AMION for all [facility] Pharmacy numbers 07/19/2024

## 2024-07-19 NOTE — Plan of Care (Signed)
   Problem: Skin Integrity: Goal: Risk for impaired skin integrity will decrease Outcome: Progressing

## 2024-07-19 NOTE — Telephone Encounter (Signed)
 Pharmacy Patient Advocate Encounter  Insurance verification completed.    The patient is insured through [facility]. Patient has Medicare and is not eligible for a copay card, but may be able to apply for patient assistance or Medicare RX Payment Plan (Patient Must reach out to their plan, if eligible for payment plan), if available.    Ran test claim for Eliquis 5mg  and the current 30 day co-pay is $47.  Ran test claim for Farxiga 10mg  and the current 30 day co-pay is $47.  Ran test claim for Brand Entresto 24-26mg  and the current 30 day co-pay is $47.  This test claim was processed through [employer]- copay amounts may vary at other pharmacies due to [employer], or as the patient moves through the different stages of their insurance plan.

## 2024-07-20 DIAGNOSIS — I200 Unstable angina: Secondary | ICD-10-CM | POA: Diagnosis not present

## 2024-07-20 DIAGNOSIS — R195 Other fecal abnormalities: Secondary | ICD-10-CM

## 2024-07-20 DIAGNOSIS — Z7901 Long term (current) use of anticoagulants: Secondary | ICD-10-CM

## 2024-07-20 DIAGNOSIS — K921 Melena: Secondary | ICD-10-CM

## 2024-07-20 DIAGNOSIS — I214 Non-ST elevation (NSTEMI) myocardial infarction: Secondary | ICD-10-CM | POA: Diagnosis not present

## 2024-07-20 DIAGNOSIS — I509 Heart failure, unspecified: Secondary | ICD-10-CM | POA: Diagnosis not present

## 2024-07-20 DIAGNOSIS — I480 Paroxysmal atrial fibrillation: Secondary | ICD-10-CM | POA: Diagnosis not present

## 2024-07-20 DIAGNOSIS — D62 Acute posthemorrhagic anemia: Secondary | ICD-10-CM | POA: Diagnosis not present

## 2024-07-20 LAB — GLUCOSE, CAPILLARY
Glucose-Capillary: 139 mg/dL — ABNORMAL HIGH (ref 70–99)
Glucose-Capillary: 141 mg/dL — ABNORMAL HIGH (ref 70–99)
Glucose-Capillary: 154 mg/dL — ABNORMAL HIGH (ref 70–99)
Glucose-Capillary: 227 mg/dL — ABNORMAL HIGH (ref 70–99)
Glucose-Capillary: 239 mg/dL — ABNORMAL HIGH (ref 70–99)
Glucose-Capillary: 277 mg/dL — ABNORMAL HIGH (ref 70–99)
Glucose-Capillary: 330 mg/dL — ABNORMAL HIGH (ref 70–99)
Glucose-Capillary: 99 mg/dL (ref 70–99)

## 2024-07-20 LAB — CBC
HCT: 22.7 % — ABNORMAL LOW (ref 39.0–52.0)
Hemoglobin: 7.8 g/dL — ABNORMAL LOW (ref 13.0–17.0)
MCH: 33.1 pg (ref 26.0–34.0)
MCHC: 34.4 g/dL (ref 30.0–36.0)
MCV: 96.2 fL (ref 80.0–100.0)
Platelets: 225 K/uL (ref 150–400)
RBC: 2.36 MIL/uL — ABNORMAL LOW (ref 4.22–5.81)
RDW: 12.3 % (ref 11.5–15.5)
WBC: 5.7 K/uL (ref 4.0–10.5)
nRBC: 0 % (ref 0.0–0.2)

## 2024-07-20 LAB — BASIC METABOLIC PANEL WITH GFR
Anion gap: 9 (ref 5–15)
BUN: 26 mg/dL — ABNORMAL HIGH (ref 8–23)
CO2: 25 mmol/L (ref 22–32)
Calcium: 7.9 mg/dL — ABNORMAL LOW (ref 8.9–10.3)
Chloride: 103 mmol/L (ref 98–111)
Creatinine, Ser: 1.97 mg/dL — ABNORMAL HIGH (ref 0.61–1.24)
GFR, Estimated: 35 mL/min — ABNORMAL LOW (ref >60)
Glucose, Bld: 91 mg/dL (ref 70–99)
Potassium: 3.7 mmol/L (ref 3.5–5.1)
Sodium: 137 mmol/L (ref 135–145)

## 2024-07-20 LAB — HEMOGLOBIN AND HEMATOCRIT, BLOOD
HCT: 26 % — ABNORMAL LOW (ref 39.0–52.0)
Hemoglobin: 8.7 g/dL — ABNORMAL LOW (ref 13.0–17.0)

## 2024-07-20 LAB — PHOSPHORUS: Phosphorus: 4.9 mg/dL — ABNORMAL HIGH (ref 2.5–4.6)

## 2024-07-20 LAB — MAGNESIUM: Magnesium: 2.2 mg/dL (ref 1.7–2.4)

## 2024-07-20 LAB — HEPARIN LEVEL (UNFRACTIONATED): Heparin Unfractionated: 0.36 [IU]/mL (ref 0.30–0.70)

## 2024-07-20 MED ORDER — SENNOSIDES-DOCUSATE SODIUM 8.6-50 MG PO TABS
2.0000 | ORAL_TABLET | Freq: Every day | ORAL | Status: DC
  Administered 2024-07-20 – 2024-07-24 (×4): 2 via ORAL
  Filled 2024-07-20 (×4): qty 2

## 2024-07-20 MED ORDER — PANTOPRAZOLE SODIUM 40 MG PO TBEC
40.0000 mg | DELAYED_RELEASE_TABLET | Freq: Two times a day (BID) | ORAL | Status: DC
  Administered 2024-07-20 – 2024-07-25 (×11): 40 mg via ORAL
  Filled 2024-07-20 (×11): qty 1

## 2024-07-20 MED ORDER — OXYCODONE HCL 5 MG PO TABS
5.0000 mg | ORAL_TABLET | Freq: Once | ORAL | Status: AC | PRN
  Administered 2024-07-20: 5 mg via ORAL
  Filled 2024-07-20: qty 1

## 2024-07-20 MED ORDER — METOPROLOL SUCCINATE ER 25 MG PO TB24
25.0000 mg | ORAL_TABLET | Freq: Every day | ORAL | Status: DC
  Administered 2024-07-20 – 2024-07-21 (×2): 25 mg via ORAL
  Filled 2024-07-20 (×2): qty 1

## 2024-07-20 MED ORDER — SODIUM CHLORIDE 0.9 % IV SOLN
400.0000 mg | Freq: Once | INTRAVENOUS | Status: AC
  Administered 2024-07-20: 400 mg via INTRAVENOUS
  Filled 2024-07-20: qty 20

## 2024-07-20 MED ORDER — IRON SUCROSE 400 MG IVPB - SIMPLE MED
400.0000 mg | Freq: Once | Status: DC
  Filled 2024-07-20: qty 270

## 2024-07-20 MED ORDER — HYDRALAZINE HCL 50 MG PO TABS
50.0000 mg | ORAL_TABLET | Freq: Three times a day (TID) | ORAL | Status: DC
  Administered 2024-07-20 – 2024-07-21 (×3): 50 mg via ORAL
  Filled 2024-07-20 (×3): qty 1

## 2024-07-20 MED ORDER — HYDRALAZINE HCL 25 MG PO TABS
25.0000 mg | ORAL_TABLET | Freq: Three times a day (TID) | ORAL | Status: DC
  Administered 2024-07-20: 25 mg via ORAL
  Filled 2024-07-20: qty 1

## 2024-07-20 MED ORDER — POTASSIUM CHLORIDE CRYS ER 20 MEQ PO TBCR
20.0000 meq | EXTENDED_RELEASE_TABLET | Freq: Once | ORAL | Status: AC
  Administered 2024-07-20: 20 meq via ORAL
  Filled 2024-07-20: qty 1

## 2024-07-20 MED ORDER — SODIUM CHLORIDE 0.9 % IV SOLN: INTRAVENOUS | Status: DC

## 2024-07-20 NOTE — Plan of Care (Signed)
  Problem: Metabolic: Goal: Ability to maintain appropriate glucose levels will improve Outcome: Not Progressing   

## 2024-07-20 NOTE — Plan of Care (Signed)
  Problem: Coping: Goal: Ability to adjust to condition or change in health will improve Outcome: Progressing   Problem: Health Behavior/Discharge Planning: Goal: Ability to manage health-related needs will improve Outcome: Progressing   Problem: Skin Integrity: Goal: Risk for impaired skin integrity will decrease Outcome: Progressing   Problem: Clinical Measurements: Goal: Will remain free from infection Outcome: Progressing   Problem: Activity: Goal: Risk for activity intolerance will decrease Outcome: Progressing    Problem: Education: Goal: Ability to demonstrate management of disease process will improve Outcome: Progressing

## 2024-07-20 NOTE — Consult Note (Addendum)
 Consultation Note   Referring Provider:   [facility] PCP: Onita Rush, MD Primary Gastroenterologist:   [place]     Reason for Consultation: Anemia and dark stool DOA: 07/17/2024         Hospital Day: 4   ASSESSMENT    73 year old male admitted with unstable angina, hypertensive urgency , acute diastolic heart failure,  and paroxysmal atrial fibrillation.  Echo 11/12 LVEF 55-60%, normal RV, small pericardial effusion, moderate LA enlargement. Cardiology following.   Normocytic anemia  /dark stools at home  Hemoglobin 10.6 (down from 13.08 December 2022).  Further decline in hemoglobin to 7.8 since admission .  Does not appear to be iron deficient  AKI Creatinine improving  Paroxysmal atrial fibrillation  Prolonged QTc  Hypertension Hypertensive urgency on admission  Type 2 diabetes  ? 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  He did have solids for breakfast today so unable to proceed today. The risks and benefits of EGD with possible biopsies were discussed with the patient who agrees to proceed.  Daughter was present via telephone during my conversation with the patient    HPI   Brief History:   Patient is a 72 year old male who presents to the ED with a month of  shortness of breath.  In the ED his BNP was significantly elevated as was his blood pressure.  Chest x-ray showed moderate bilateral pleural effusions.  CT angio of the chest was negative for PE.  Troponin was elevated.  He was started on nitroglycerin drip, heparin and given Lasix.  He was seen by cardiology for hypertensive urgency, a brief episode of atrial fibrillation, unstable angina and suspected acute heart failure.  Found to be anemic, reported dark stools at home.  For at least 6 months he has intermittently had very dark (nearly black) stools at home alternating with normal colored bowel movements.  He occasionally takes bismuth but does not relate the change in stool color to bismuth.  He has not had any associated abdominal pain, nausea/vomiting, or weight loss.  His last dark stool was a couple of days ago . He does take a daily baby aspirin, no other NSAIDS  Admission labs notable for :  BNP 2857 , high-sensitivity troponin 31 , creatinine 1.02 notable for hemoglobin 10.6 , MCV 97.6, platelets 322, creatinine 1.49, albumin 2.8, remainder of LFTs normal,   Since admission his hemoglobin has declined from 10.6 to 7.8      He reports having had  normal  colonoscopy approximately 5 years ago through [facility].  Unable to see report in Care Everywhere.  No known family  history of colon cancer                                        Labs and Imaging:  Recent Labs    07/17/24 1443 07/18/24 1406 07/19/24 0420  PROT 6.9 5.9* 5.3*  ALBUMIN 3.6 2.8* 2.4*  AST 46* 27 21  ALT 33 25 21  ALKPHOS 102 60 60  BILITOT 0.5 0.5 0.7  BILIDIR <0.1  --   --   IBILI NOT CALCULATED  --   --    Recent Labs    07/18/24 1406 07/19/24 0420 07/19/24 1746 07/20/24 0351  WBC 6.7 5.8  --  5.7  HGB 8.9* 8.0* 7.7* 7.8*  HCT 26.5* 23.7* 22.4* 22.7*  MCV 94.6 96.3  --  96.2  PLT 295 238  --  225   Recent Labs    07/18/24 1406 07/19/24 0420 07/20/24 0351  NA 137 134* 137  K 3.8 4.6 3.7  CL 103 98  103  CO2 24 24 25   GLUCOSE 151* 432* 91  BUN 16 23 26*  CREATININE 1.49* 2.24* 1.97*  CALCIUM 8.2* 7.7* 7.9*     ECHOCARDIOGRAM COMPLETE    ECHOCARDIOGRAM REPORT       Patient Name:   Oscar Moses Date of Exam: 07/19/2024 Medical Rec #:  [id number]    Height:       69.0 in Accession #:    [id number]   Weight:       157.5 lb Date of Birth:  1950/10/15    BSA:          1.867 m Patient Age:    73 years     BP:           126/60 mmHg Patient Gender: M            HR:           70 bpm. Exam Location:  Inpatient  Procedure: 2D Echo, Color Doppler and Cardiac Doppler (Both Spectral and Color            Flow Doppler were utilized during procedure).  Indications:    NSTEMI i21.4   History:        Patient has no prior history of Echocardiogram examinations.                 COPD; Risk Factors:Hypertension, Diabetes and Dyslipidemia.   Sonographer:    Merlynn Argyle Referring Phys: [id number] LOGAN N LOCKWOOD  IMPRESSIONS   1. Left ventricular ejection fraction, by estimation, is 55 to 60%. The left ventricle has normal function. The left ventricle has no regional wall motion abnormalities. Left ventricular diastolic parameters are consistent with Grade I diastolic  dysfunction (impaired relaxation).  2. Right ventricular systolic function is normal. The right ventricular size is normal.  3. Left atrial size was moderately dilated.  4. Right atrial size was mildly dilated.  5. A small pericardial effusion is present. There is no evidence of cardiac tamponade.  6. The mitral valve is normal in structure. Mild mitral valve regurgitation. No evidence of mitral stenosis.  7. The aortic valve is tricuspid. Aortic valve regurgitation is not visualized. No aortic stenosis is present.  8. The inferior vena cava is dilated in size with <50% respiratory variability, suggesting right atrial pressure of 15 mmHg.  FINDINGS  Left Ventricle: Left ventricular ejection fraction, by estimation, is 55 to  60%.  The left ventricle has normal function. The left ventricle has no regional wall motion abnormalities. The left ventricular internal cavity size was normal in size. There is  no left ventricular hypertrophy. Left ventricular diastolic parameters are consistent with Grade I diastolic dysfunction (impaired relaxation).  Right Ventricle: The right ventricular size is normal. No increase in right ventricular wall thickness. Right ventricular systolic function is normal.  Left [facility]: Left atrial size was moderately dilated.  Right [facility]: Right atrial size was mildly dilated.  Pericardium: A small pericardial effusion is present. There is no evidence of cardiac tamponade.  Mitral Valve: The mitral valve is normal in structure. Mild mitral valve regurgitation. No evidence of mitral valve stenosis.  Tricuspid Valve: The tricuspid valve is normal in structure. Tricuspid valve regurgitation is not demonstrated. No evidence of tricuspid stenosis.  Aortic Valve: The aortic valve is tricuspid. Aortic valve regurgitation is not visualized. No aortic stenosis is present.  Pulmonic Valve: The pulmonic valve was normal in structure. Pulmonic valve regurgitation is not visualized. No evidence of pulmonic stenosis.  Aorta: The aortic root is normal in size and structure.  Venous: The inferior vena cava is dilated in size with less than 50% respiratory variability, suggesting right atrial pressure of 15 mmHg.  IAS/Shunts: No atrial level shunt detected by color flow Doppler.    LEFT VENTRICLE PLAX 2D LVIDd:         4.90 cm   Diastology LVIDs:         3.60 cm   LV e' medial:    5.84 cm/s LV PW:         1.10 cm   LV E/e' medial:  13.9 LV IVS:        1.20 cm   LV e' lateral:   7.22 cm/s LVOT diam:     2.30 cm   LV E/e' lateral: 11.2 LV SV:         79 LV SV Index:   42 LVOT Area:     4.15 cm LV IVRT:       130 msec    RIGHT VENTRICLE             IVC RV Basal diam:  3.95 cm     IVC diam: 2.40 cm RV  S prime:     12.90 cm/s TAPSE (M-mode): 2.4 cm  LEFT [facility]UM             Index        RIGHT ATRIUM           Index LA diam:        4.50 cm 2.41 cm/m   RA Area:     22.20 cm LA Vol (A2C):   86.2 ml 46.18 ml/m  RA Volume:   69.50 ml  37.23 ml/m LA Vol (A4C):   80.7 ml 43.23 ml/m LA Biplane Vol: 87.6 ml 46.93 ml/m  AORTIC VALVE LVOT Vmax:   91.60 cm/s LVOT Vmean:  61.400 cm/s LVOT VTI:    0.189 m   AORTA Ao Root diam: 3.50 cm  MITRAL VALVE MV Area (PHT): 2.39 cm    SHUNTS MV Decel Time: 318 msec    Systemic VTI:  0.19 m MV E velocity: 80.90 cm/s  Systemic Diam: 2.30 cm MV A velocity: 74.70 cm/s MV E/A ratio:  1.08  Morene Brownie Electronically signed by Morene Brownie Signature Date/Time: 07/19/2024/12:35:01 PM      Final       Past Medical History:  Diagnosis Date   Diabetes mellitus without complication (HCC)    History of severe acute respiratory syndrome coronavirus 2 (SARS-CoV-2) disease 10/09/2019    Past Surgical History:  Procedure Laterality Date   BACK SURGERY      History reviewed. No pertinent family history.  Prior to Admission medications   Medication Sig Start Date End Date Taking? Authorizing Provider  aspirin EC 81 MG tablet Take 81 mg by mouth daily. 11/08/13  Yes [provider]  Continuous Glucose Sensor (FREESTYLE LIBRE 3 SENSOR) MISC 1 each by Other route every 14 (fourteen) days. 04/07/24  Yes [provider]  fosinopril (MONOPRIL) 40 MG tablet Take 40 mg by mouth 2 (two) times daily. 09/10/13  Yes [provider]  HYDROcodone-acetaminophen (NORCO) 10-325 MG tablet Take 1 tablet by mouth 4 (four) times daily as needed.   Yes [provider]  insulin lispro (HUMALOG) 100 UNIT/ML KwikPen See admin instructions. Use per sliding scale three to four times daily as directed. 11/13/19  Yes [provider]  simvastatin (ZOCOR) 20 MG tablet Take 20 mg by mouth every morning. 09/30/13  Yes [provider]  traZODone (DESYREL) 50 MG tablet Take 75 mg by mouth at bedtime as needed. 02/11/24  Yes [provider]  TRESIBA FLEXTOUCH 100 UNIT/ML FlexTouch Pen Inject 12 Units into the skin at bedtime. 11/13/19  Yes [provider]    Current Facility-Administered Medications  Medication Dose Route Frequency Provider Last Rate Last Admin   acetaminophen (TYLENOL) tablet 650 mg  650 mg Oral Q6H PRN Seena Marsa NOVAK, MD       Or   acetaminophen (TYLENOL) suppository 650 mg  650 mg Rectal Q6H PRN Seena Marsa NOVAK, MD       aspirin EC tablet 81 mg  81 mg Oral Daily Melvin, Alexander B, MD   81 mg at 07/19/24 0816   glucagon (human recombinant) (GLUCAGEN) injection 1 mg  1 mg Intravenous PRN Amin, Ankit C, MD       hydrALAZINE (APRESOLINE) injection 10 mg  10 mg Intravenous Q4H PRN Amin, Ankit C, MD       hydrALAZINE (APRESOLINE) tablet 25 mg  25 mg Oral Q8H Roberts, Lindsay B, NP       HYDROcodone-acetaminophen (NORCO) 10-325 MG per tablet 1 tablet  1 tablet Oral Q6H PRN Amin, Ankit C, MD   1 tablet at 07/19/24 2301   insulin aspart (novoLOG) injection 0-15 Units  0-15 Units Subcutaneous Q4H Amin, Ankit C, MD   5 Units at 07/20/24 0045   insulin glargine-yfgn (SEMGLEE) injection 12 Units  12 Units Subcutaneous QHS Amin, Ankit C, MD   12 Units at 07/19/24 2120   ipratropium-albuterol  (DUONEB) 0.5-2.5 (3) MG/3ML nebulizer solution 3 mL  3 mL Nebulization Q4H PRN Amin, Ankit C, MD       iron sucrose (VENOFER) 400 mg in sodium chloride  0.9 % 250 mL IVPB  400 mg Intravenous Once Amin, Ankit C, MD       metoprolol succinate (TOPROL-XL) 24 hr tablet 25 mg  25 mg Oral Daily Henry Shaver B, NP       metoprolol tartrate (LOPRESSOR) injection 5 mg  5 mg Intravenous Q4H PRN Amin, Ankit C, MD       nitroGLYCERIN 50 mg in dextrose 5 % 250 mL (0.2 mg/mL) infusion  0-200 mcg/min Intravenous Continuous Henry Shaver B, NP 36 mL/hr at 07/20/24 0721 120 mcg/min at 07/20/24 0721    ondansetron (ZOFRAN) injection 4 mg  4 mg Intravenous Q6H PRN Amin, Ankit C, MD       pantoprazole (PROTONIX) EC tablet 40 mg  40 mg Oral BID Henry Shaver B, NP       polyethylene glycol (MIRALAX / GLYCOLAX) packet 17 g  17 g Oral Daily PRN Seena Marsa NOVAK, MD       potassium chloride SA (KLOR-CON M) CR tablet 20 mEq  20 mEq Oral Once Cyndy Ozell DASEN, RPH       senna-docusate (Senokot-S) tablet 2 tablet  2 tablet Oral QHS Amin, Ankit C, MD       simvastatin (ZOCOR) tablet 20 mg  20 mg Oral q morning Seena Marsa NOVAK, MD   20 mg at 07/19/24 9183   sodium chloride  flush (NS) 0.9 % injection 3 mL  3 mL Intravenous Q12H Seena Marsa NOVAK, MD   3 mL at 07/19/24 2123   traZODone (DESYREL) tablet 75 mg  75 mg Oral QHS PRN Seena Marsa NOVAK, MD   75 mg at 07/19/24 2301    Allergies as of 07/17/2024   (No Known Allergies)    Social History   Socioeconomic History   Marital status: Married    Spouse name: Not on file   Number of children: Not on file   Years of education: Not on file   Highest education level: Not on file  Occupational History   Not on file  Tobacco Use   Smoking status: Former    Types: Cigarettes   Smokeless tobacco: Never  Substance and Sexual Activity   Alcohol use: Not Currently   Drug use: Never   Sexual activity: Not on file  Other Topics Concern   Not on file  Social History Narrative   Not on file   Social Drivers of Health   Financial Resource Strain: Not on file  Food Insecurity: No Food Insecurity (07/18/2024)   Hunger Vital Sign    Worried About Running Out of Food in the Last Year: Never true    Ran Out of Food in the Last Year: Never true  Transportation Needs: No Transportation Needs (07/18/2024)   PRAPARE - Administrator, Civil Service (Medical): No    Lack of Transportation (Non-Medical): No  Physical Activity: Not on file  Stress: Not on file  Social Connections: Moderately Isolated (07/18/2024)   Social  Connection and Isolation Panel    Frequency of Communication with Friends and Family: More than three times a week    Frequency of Social Gatherings with Friends and Family: More than three times a week    Attends Religious Services: Never    Database Administrator or Organizations: No    Attends Banker Meetings: Never    Marital Status: Married  Catering Manager Violence: Not At Risk (07/18/2024)   Humiliation, Afraid, Rape, and Kick questionnaire    Fear of Current or Ex-Partner: No    Emotionally Abused: No    Physically Abused: No    Sexually Abused: No     Code Status   Code Status: Full Code  Review of Systems: All systems reviewed and negative except where noted in HPI.  Physical Exam: Vital signs in last 24 hours: Temp:  [97.8 F (36.6 C)-98.2 F (36.8 C)] 98.2 F (36.8 C) (11/13 0735) Pulse Rate:  [72-85] 72 (11/13 0735) Resp:  [16-19] 17 (11/13 0735) BP: (124-147)/(53-68) 147/68 (11/13 0735) SpO2:  [97 %] 97 % (11/12 1500) Weight:  [72.3 kg] 72.3 kg (11/13  9561) Last BM Date : 07/14/24  General:  Pleasant male in NAD Psych:  Cooperative. Normal mood and affect Eyes: Pupils equal Ears:  Normal auditory acuity Nose: No deformity, discharge or lesions Neck:  Supple, no masses felt Lungs:  Clear to auscultation.  Heart:  Regular rate, regular rhythm.  Abdomen:  Soft, nondistended, nontender, active bowel sounds, no masses felt Rectal :  Deferred Msk: Symmetrical without gross deformities.  Neurologic:  Alert, oriented, grossly normal neurologically Extremities : No edema Skin:  Intact without significant lesions.    Intake/Output from previous day: 11/12 0701 - 11/13 0700 In: 2288.8 [I.V.:2288.8] Out: 700 [Urine:700] Intake/Output this shift:  No intake/output data recorded.   Vina Dasen, NP-C   07/20/2024, 8:46 AM

## 2024-07-20 NOTE — Progress Notes (Signed)
 PROGRESS NOTE    Oscar Moses  FMW:[id number] DOB: June 23, 1951 DOA: 07/17/2024 PCP: Onita Norleen, MD    Brief Narrative:   73 male HTN, HLD, CKD 2, GERD, DM2, Depression, celiac disease, COPD, anemia, low back pain admitted for shortness of breath.  Upon admission noted to have signs of volume overload with moderate bilateral pleural effusion/small pericardial effusion.  No PE seen on the CTA.  Cardiology was consulted and cautiously diuresing due to AKI. Due to persistent anemia, GI team consulted for their input as patient will need to be on anticoagulation.  Assessment & Plan:    Unstable angina Congestive heart failure Acute respiratory failure with hypoxia Patient has been seen by cardiology team.  Overall troponins are flat but there is concerns of ACS.  Plans for RHC/LHC but may have to hold off today due to AKI - Echocardiogram. -A1c 9.6, LDL 31  Anemia - Admission hemoglobin 10.1, slowly has continued to drift down less than 8.  This morning 7.7.  Tells me occasionally he has been having dark hard stools for the past 2 months.  Reports his colonoscopy was over 5 years ago at [facility] which did not show any significant findings.  Consulted GI this morning who will plan on endoscopic evaluation once cleared by cardiac service.   Prolonged QTc -Resolved  AKI -Do not believe patient has CKD as baseline creatinine is 1.0 with normal GFR.  Admission creatinine 1.49 trending up 2.24 > 1.97. Holding aldactone.    Hypertension ?Hypertension emergency Initial systolic blood pressure greater than 200 therefore started on nitroglycerin drip.  Blood pressure has now stabilized.  Will wean this off, on Toprol-XL, Aldactone.   Atrial fibrillation, new onset Seen by cardiology, stopping anticoagulation for now   Hyperlipidemia -Statin  Insulin-dependent diabetes mellitus type 2, uncontrolled due to hyperglycemia -Sliding scale and Accu-Cheks.  Premeal and long-acting.  Adjust as  necessary   Celiac disease - Noted   COPD -As needed bronchodilators     DVT prophylaxis: Hep Drip      Code Status: Full Code Family Communication:   Status is: Inpatient Ongoing management of heart issues   PT Follow up Recs:   Subjective: This morning reports to me of occasional dark stools at home for the past 2 months.  Reportedly had normal colonoscopy about 5 years ago at [facility].   Examination:  General exam: Appears calm and comfortable  Respiratory system: Clear to auscultation. Respiratory effort normal. Cardiovascular system: S1 & S2 heard, RRR. No JVD, murmurs, rubs, gallops or clicks. No pedal edema. Gastrointestinal system: Abdomen is nondistended, soft and nontender. No organomegaly or masses felt. Normal bowel sounds heard. Central nervous system: Alert and oriented. No focal neurological deficits. Extremities: Symmetric 5 x 5 power. Skin: No rashes, lesions or ulcers Psychiatry: Judgement and insight appear normal. Mood & affect appropriate.                Diet Orders (From admission, onward)     Start     Ordered   07/21/24 0001  Diet NPO time specified Except for: Sips with Meds  Diet effective midnight       Question:  Except for  Answer:  Sips with Meds   07/20/24 1030   07/19/24 1124  Diet Carb Modified Fluid consistency: Thin; Room service appropriate? Yes  Diet effective now       Question Answer Comment  Diet-HS Snack? Nothing   Calorie Level Medium 1600-2000   Fluid consistency: Thin  Room service appropriate? Yes      07/19/24 1123            Objective: Vitals:   07/20/24 0015 07/20/24 0438 07/20/24 0735 07/20/24 0925  BP: (!) 144/66 131/63 (!) 147/68 (!) 142/68  Pulse: 74 85 72 84  Resp: 16 19 17    Temp: 97.9 F (36.6 C) 97.9 F (36.6 C) 98.2 F (36.8 C)   TempSrc: Oral Oral Oral   SpO2:      Weight:  72.3 kg    Height:        Intake/Output Summary (Last 24 hours) at 07/20/2024 1100 Last data filed at  07/20/2024 0915 Gross per 24 hour  Intake 2288.77 ml  Output 725 ml  Net 1563.77 ml   Filed Weights   07/18/24 1225 07/19/24 0441 07/20/24 0438  Weight: 71.2 kg 71.4 kg 72.3 kg    Scheduled Meds:  aspirin EC  81 mg Oral Daily   hydrALAZINE  25 mg Oral Q8H   insulin aspart  0-15 Units Subcutaneous Q4H   insulin glargine-yfgn  12 Units Subcutaneous QHS   metoprolol succinate  25 mg Oral Daily   pantoprazole  40 mg Oral BID   senna-docusate  2 tablet Oral QHS   simvastatin  20 mg Oral q morning   sodium chloride  flush  3 mL Intravenous Q12H   Continuous Infusions:  iron sucrose     nitroGLYCERIN 120 mcg/min (07/20/24 0721)    Nutritional status     Body mass index is 23.52 kg/m.  Data Reviewed:   CBC: Recent Labs  Lab 07/17/24 1129 07/17/24 1442 07/18/24 1406 07/19/24 0420 07/19/24 1746 07/20/24 0351  WBC 6.7 6.5 6.7 5.8  --  5.7  HGB 10.6* 10.1* 8.9* 8.0* 7.7* 7.8*  HCT 31.9* 29.8* 26.5* 23.7* 22.4* 22.7*  MCV 97.6 96.1 94.6 96.3  --  96.2  PLT 322 257 295 238  --  225   Basic Metabolic Panel: Recent Labs  Lab 07/17/24 1129 07/17/24 1442 07/18/24 1406 07/19/24 0420 07/20/24 0351  NA 141  --  137 134* 137  K 4.5  --  3.8 4.6 3.7  CL 103  --  103 98 103  CO2 27  --  24 24 25   GLUCOSE 139*  --  151* 432* 91  BUN 18  --  16 23 26*  CREATININE 1.02 1.00 1.49* 2.24* 1.97*  CALCIUM 9.5  --  8.2* 7.7* 7.9*  MG  --   --  1.9  --  2.2  PHOS  --   --   --   --  4.9*   GFR: Estimated Creatinine Clearance: 33.4 mL/min (A) (by C-G formula based on SCr of 1.97 mg/dL (H)). Liver Function Tests: Recent Labs  Lab 07/17/24 1443 07/18/24 1406 07/19/24 0420  AST 46* 27 21  ALT 33 25 21  ALKPHOS 102 60 60  BILITOT 0.5 0.5 0.7  PROT 6.9 5.9* 5.3*  ALBUMIN 3.6 2.8* 2.4*   No results for input(s): LIPASE, AMYLASE in the last 168 hours. No results for input(s): AMMONIA in the last 168 hours. Coagulation Profile: No results for input(s): INR,  PROTIME in the last 168 hours. Cardiac Enzymes: No results for input(s): CKTOTAL, CKMB, CKMBINDEX, TROPONINI in the last 168 hours. BNP (last 3 results) Recent Labs    07/17/24 1129  PROBNP 2,857.0*   HbA1C: Recent Labs    07/17/24 1442  HGBA1C 9.6*   CBG: Recent Labs  Lab 07/19/24 1535 07/19/24 2103  07/20/24 0032 07/20/24 0435 07/20/24 0737  GLUCAP 116* 337* 239* 99 141*   Lipid Profile: Recent Labs    07/19/24 0420  CHOL 98  HDL 38*  LDLCALC 31  TRIG 852  CHOLHDL 2.6   Thyroid Function Tests: Recent Labs    07/19/24 0420  TSH 1.144   Anemia Panel: Recent Labs    07/19/24 1016  VITAMINB12 2,238*  FOLATE >20.0  FERRITIN 65  TIBC 262  IRON 56  RETICCTPCT 1.8   Sepsis Labs: Recent Labs  Lab 07/19/24 1016 07/19/24 1435  LATICACIDVEN 3.2* 1.8    No results found for this or any previous visit (from the past 240 hours).       Radiology Studies: ECHOCARDIOGRAM COMPLETE Result Date: 07/19/2024    ECHOCARDIOGRAM REPORT   Patient Name:   YASSIN SCALES [facility] Date of Exam: 07/19/2024 Medical Rec #:  [id number]    Height:       69.0 in Accession #:    [id number]   Weight:       157.5 lb Date of Birth:  06-10-1951    BSA:          1.867 m Patient Age:    73 years     BP:           126/60 mmHg Patient Gender: M            HR:           70 bpm. Exam Location:  Inpatient Procedure: 2D Echo, Color Doppler and Cardiac Doppler (Both Spectral and Color            Flow Doppler were utilized during procedure). Indications:    NSTEMI i21.4  History:        Patient has no prior history of Echocardiogram examinations.                 COPD; Risk Factors:Hypertension, Diabetes and Dyslipidemia.  Sonographer:    Merlynn Argyle Referring Phys: [id number] LOGAN N LOCKWOOD IMPRESSIONS  1. Left ventricular ejection fraction, by estimation, is 55 to 60%. The left ventricle has normal function. The left ventricle has no regional wall motion abnormalities. Left ventricular diastolic  parameters are consistent with Grade I diastolic dysfunction (impaired relaxation).  2. Right ventricular systolic function is normal. The right ventricular size is normal.  3. Left atrial size was moderately dilated.  4. Right atrial size was mildly dilated.  5. A small pericardial effusion is present. There is no evidence of cardiac tamponade.  6. The mitral valve is normal in structure. Mild mitral valve regurgitation. No evidence of mitral stenosis.  7. The aortic valve is tricuspid. Aortic valve regurgitation is not visualized. No aortic stenosis is present.  8. The inferior vena cava is dilated in size with <50% respiratory variability, suggesting right atrial pressure of 15 mmHg. FINDINGS  Left Ventricle: Left ventricular ejection fraction, by estimation, is 55 to 60%. The left ventricle has normal function. The left ventricle has no regional wall motion abnormalities. The left ventricular internal cavity size was normal in size. There is  no left ventricular hypertrophy. Left ventricular diastolic parameters are consistent with Grade I diastolic dysfunction (impaired relaxation). Right Ventricle: The right ventricular size is normal. No increase in right ventricular wall thickness. Right ventricular systolic function is normal. Left [facility]: Left atrial size was moderately dilated. Right [facility]: Right atrial size was mildly dilated. Pericardium: A small pericardial effusion is present. There is no evidence of cardiac tamponade. Mitral Valve: The  mitral valve is normal in structure. Mild mitral valve regurgitation. No evidence of mitral valve stenosis. Tricuspid Valve: The tricuspid valve is normal in structure. Tricuspid valve regurgitation is not demonstrated. No evidence of tricuspid stenosis. Aortic Valve: The aortic valve is tricuspid. Aortic valve regurgitation is not visualized. No aortic stenosis is present. Pulmonic Valve: The pulmonic valve was normal in structure. Pulmonic valve regurgitation is not  visualized. No evidence of pulmonic stenosis. Aorta: The aortic root is normal in size and structure. Venous: The inferior vena cava is dilated in size with less than 50% respiratory variability, suggesting right atrial pressure of 15 mmHg. IAS/Shunts: No atrial level shunt detected by color flow Doppler.  LEFT VENTRICLE PLAX 2D LVIDd:         4.90 cm   Diastology LVIDs:         3.60 cm   LV e' medial:    5.84 cm/s LV PW:         1.10 cm   LV E/e' medial:  13.9 LV IVS:        1.20 cm   LV e' lateral:   7.22 cm/s LVOT diam:     2.30 cm   LV E/e' lateral: 11.2 LV SV:         79 LV SV Index:   42 LVOT Area:     4.15 cm LV IVRT:       130 msec  RIGHT VENTRICLE             IVC RV Basal diam:  3.95 cm     IVC diam: 2.40 cm RV S prime:     12.90 cm/s TAPSE (M-mode): 2.4 cm LEFT ATRIUM             Index        RIGHT ATRIUM           Index LA diam:        4.50 cm 2.41 cm/m   RA Area:     22.20 cm LA Vol (A2C):   86.2 ml 46.18 ml/m  RA Volume:   69.50 ml  37.23 ml/m LA Vol (A4C):   80.7 ml 43.23 ml/m LA Biplane Vol: 87.6 ml 46.93 ml/m  AORTIC VALVE LVOT Vmax:   91.60 cm/s LVOT Vmean:  61.400 cm/s LVOT VTI:    0.189 m  AORTA Ao Root diam: 3.50 cm MITRAL VALVE MV Area (PHT): 2.39 cm    SHUNTS MV Decel Time: 318 msec    Systemic VTI:  0.19 m MV E velocity: 80.90 cm/s  Systemic Diam: 2.30 cm MV A velocity: 74.70 cm/s MV E/A ratio:  1.08 Morene Brownie Electronically signed by Morene Brownie Signature Date/Time: 07/19/2024/12:35:01 PM    Final            LOS: 2 days   Time spent= 35 mins    Burgess JAYSON Dare, MD [facility]  If 7PM-7AM, please contact night-coverage  07/20/2024, 11:00 AM

## 2024-07-20 NOTE — Inpatient Diabetes Management (Signed)
 Inpatient Diabetes Program Recommendations  AACE/ADA: New Consensus Statement on Inpatient Glycemic Control (2015)  Target Ranges:  Prepandial:   less than 140 mg/dL      Peak postprandial:   less than 180 mg/dL (1-2 hours)      Critically ill patients:  140 - 180 mg/dL   Lab Results  Component Value Date   GLUCAP 330 (H) 07/20/2024   HGBA1C 9.6 (H) 07/17/2024    Review of Glycemic Control  Latest Reference Range & Units 07/20/24 07:37 07/20/24 11:36  Glucose-Capillary 70 - 99 mg/dL 858 (H) 669 (H)  (H): Data is abnormally high  Diabetes history: DM1(does not make insulin.  Needs correction, basal and meal coverage)  Outpatient Diabetes medications: Tresiba 12 units every day, Novolog 0-20 units TID  Current orders for Inpatient glycemic control: Tresiba 12 units every day, Novolog 0-15 units Q4H  Inpatient Diabetes Program Recommendations:    Noted NPO 11/14 after MN  Please consider:  Novolog 4 units TID with meals if he consumes at least 50%  Thank you, Wyvonna Pinal, MSN, CDCES Diabetes Coordinator Inpatient Diabetes Program [phone] (team pager from 8a-5p)

## 2024-07-20 NOTE — H&P (View-Only) (Signed)
 Consultation Note   Referring Provider:   [facility] PCP: Onita Rush, MD Primary Gastroenterologist:   [place]     Reason for Consultation: Anemia and dark stool DOA: 07/17/2024         Hospital Day: 4   ASSESSMENT    73 year old male admitted with unstable angina, hypertensive urgency , acute diastolic heart failure,  and paroxysmal atrial fibrillation.  Echo 11/12 LVEF 55-60%, normal RV, small pericardial effusion, moderate LA enlargement. Cardiology following.   Normocytic anemia  /dark stools at home  Hemoglobin 10.6 (down from 13.08 December 2022).  Further decline in hemoglobin to 7.8 since admission .  Does not appear to be iron deficient  AKI Creatinine improving  Paroxysmal atrial fibrillation  Prolonged QTc  Hypertension Hypertensive urgency on admission  Type 2 diabetes  ? 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  He did have solids for breakfast today so unable to proceed today. The risks and benefits of EGD with possible biopsies were discussed with the patient who agrees to proceed.  Daughter was present via telephone during my conversation with the patient    HPI   Brief History:   Patient is a 72 year old male who presents to the ED with a month of  shortness of breath.  In the ED his BNP was significantly elevated as was his blood pressure.  Chest x-ray showed moderate bilateral pleural effusions.  CT angio of the chest was negative for PE.  Troponin was elevated.  He was started on nitroglycerin drip, heparin and given Lasix.  He was seen by cardiology for hypertensive urgency, a brief episode of atrial fibrillation, unstable angina and suspected acute heart failure.  Found to be anemic, reported dark stools at home.  For at least 6 months he has intermittently had very dark (nearly black) stools at home alternating with normal colored bowel movements.  He occasionally takes bismuth but does not relate the change in stool color to bismuth.  He has not had any associated abdominal pain, nausea/vomiting, or weight loss.  His last dark stool was a couple of days ago . He does take a daily baby aspirin, no other NSAIDS  Admission labs notable for :  BNP 2857 , high-sensitivity troponin 31 , creatinine 1.02 notable for hemoglobin 10.6 , MCV 97.6, platelets 322, creatinine 1.49, albumin 2.8, remainder of LFTs normal,   Since admission his hemoglobin has declined from 10.6 to 7.8      He reports having had  normal  colonoscopy approximately 5 years ago through [facility].  Unable to see report in Care Everywhere.  No known family  history of colon cancer                                        Labs and Imaging:  Recent Labs    07/17/24 1443 07/18/24 1406 07/19/24 0420  PROT 6.9 5.9* 5.3*  ALBUMIN 3.6 2.8* 2.4*  AST 46* 27 21  ALT 33 25 21  ALKPHOS 102 60 60  BILITOT 0.5 0.5 0.7  BILIDIR <0.1  --   --   IBILI NOT CALCULATED  --   --    Recent Labs    07/18/24 1406 07/19/24 0420 07/19/24 1746 07/20/24 0351  WBC 6.7 5.8  --  5.7  HGB 8.9* 8.0* 7.7* 7.8*  HCT 26.5* 23.7* 22.4* 22.7*  MCV 94.6 96.3  --  96.2  PLT 295 238  --  225   Recent Labs    07/18/24 1406 07/19/24 0420 07/20/24 0351  NA 137 134* 137  K 3.8 4.6 3.7  CL 103 98  103  CO2 24 24 25   GLUCOSE 151* 432* 91  BUN 16 23 26*  CREATININE 1.49* 2.24* 1.97*  CALCIUM 8.2* 7.7* 7.9*     ECHOCARDIOGRAM COMPLETE    ECHOCARDIOGRAM REPORT       Patient Name:   MANVEER GOMES Kelly Date of Exam: 07/19/2024 Medical Rec #:  [id number]    Height:       69.0 in Accession #:    [id number]   Weight:       157.5 lb Date of Birth:  1950/10/15    BSA:          1.867 m Patient Age:    73 years     BP:           126/60 mmHg Patient Gender: M            HR:           70 bpm. Exam Location:  Inpatient  Procedure: 2D Echo, Color Doppler and Cardiac Doppler (Both Spectral and Color            Flow Doppler were utilized during procedure).  Indications:    NSTEMI i21.4   History:        Patient has no prior history of Echocardiogram examinations.                 COPD; Risk Factors:Hypertension, Diabetes and Dyslipidemia.   Sonographer:    Merlynn Argyle Referring Phys: [id number] LOGAN N LOCKWOOD  IMPRESSIONS   1. Left ventricular ejection fraction, by estimation, is 55 to 60%. The left ventricle has normal function. The left ventricle has no regional wall motion abnormalities. Left ventricular diastolic parameters are consistent with Grade I diastolic  dysfunction (impaired relaxation).  2. Right ventricular systolic function is normal. The right ventricular size is normal.  3. Left atrial size was moderately dilated.  4. Right atrial size was mildly dilated.  5. A small pericardial effusion is present. There is no evidence of cardiac tamponade.  6. The mitral valve is normal in structure. Mild mitral valve regurgitation. No evidence of mitral stenosis.  7. The aortic valve is tricuspid. Aortic valve regurgitation is not visualized. No aortic stenosis is present.  8. The inferior vena cava is dilated in size with <50% respiratory variability, suggesting right atrial pressure of 15 mmHg.  FINDINGS  Left Ventricle: Left ventricular ejection fraction, by estimation, is 55 to  60%.  The left ventricle has normal function. The left ventricle has no regional wall motion abnormalities. The left ventricular internal cavity size was normal in size. There is  no left ventricular hypertrophy. Left ventricular diastolic parameters are consistent with Grade I diastolic dysfunction (impaired relaxation).  Right Ventricle: The right ventricular size is normal. No increase in right ventricular wall thickness. Right ventricular systolic function is normal.  Left [facility]: Left atrial size was moderately dilated.  Right [facility]: Right atrial size was mildly dilated.  Pericardium: A small pericardial effusion is present. There is no evidence of cardiac tamponade.  Mitral Valve: The mitral valve is normal in structure. Mild mitral valve regurgitation. No evidence of mitral valve stenosis.  Tricuspid Valve: The tricuspid valve is normal in structure. Tricuspid valve regurgitation is not demonstrated. No evidence of tricuspid stenosis.  Aortic Valve: The aortic valve is tricuspid. Aortic valve regurgitation is not visualized. No aortic stenosis is present.  Pulmonic Valve: The pulmonic valve was normal in structure. Pulmonic valve regurgitation is not visualized. No evidence of pulmonic stenosis.  Aorta: The aortic root is normal in size and structure.  Venous: The inferior vena cava is dilated in size with less than 50% respiratory variability, suggesting right atrial pressure of 15 mmHg.  IAS/Shunts: No atrial level shunt detected by color flow Doppler.    LEFT VENTRICLE PLAX 2D LVIDd:         4.90 cm   Diastology LVIDs:         3.60 cm   LV e' medial:    5.84 cm/s LV PW:         1.10 cm   LV E/e' medial:  13.9 LV IVS:        1.20 cm   LV e' lateral:   7.22 cm/s LVOT diam:     2.30 cm   LV E/e' lateral: 11.2 LV SV:         79 LV SV Index:   42 LVOT Area:     4.15 cm LV IVRT:       130 msec    RIGHT VENTRICLE             IVC RV Basal diam:  3.95 cm     IVC diam: 2.40 cm RV  S prime:     12.90 cm/s TAPSE (M-mode): 2.4 cm  LEFT [facility]UM             Index        RIGHT ATRIUM           Index LA diam:        4.50 cm 2.41 cm/m   RA Area:     22.20 cm LA Vol (A2C):   86.2 ml 46.18 ml/m  RA Volume:   69.50 ml  37.23 ml/m LA Vol (A4C):   80.7 ml 43.23 ml/m LA Biplane Vol: 87.6 ml 46.93 ml/m  AORTIC VALVE LVOT Vmax:   91.60 cm/s LVOT Vmean:  61.400 cm/s LVOT VTI:    0.189 m   AORTA Ao Root diam: 3.50 cm  MITRAL VALVE MV Area (PHT): 2.39 cm    SHUNTS MV Decel Time: 318 msec    Systemic VTI:  0.19 m MV E velocity: 80.90 cm/s  Systemic Diam: 2.30 cm MV A velocity: 74.70 cm/s MV E/A ratio:  1.08  Morene Brownie Electronically signed by Morene Brownie Signature Date/Time: 07/19/2024/12:35:01 PM      Final       Past Medical History:  Diagnosis Date   Diabetes mellitus without complication (HCC)    History of severe acute respiratory syndrome coronavirus 2 (SARS-CoV-2) disease 10/09/2019    Past Surgical History:  Procedure Laterality Date   BACK SURGERY      History reviewed. No pertinent family history.  Prior to Admission medications   Medication Sig Start Date End Date Taking? Authorizing Provider  aspirin EC 81 MG tablet Take 81 mg by mouth daily. 11/08/13  Yes [provider]  Continuous Glucose Sensor (FREESTYLE LIBRE 3 SENSOR) MISC 1 each by Other route every 14 (fourteen) days. 04/07/24  Yes [provider]  fosinopril (MONOPRIL) 40 MG tablet Take 40 mg by mouth 2 (two) times daily. 09/10/13  Yes [provider]  HYDROcodone-acetaminophen (NORCO) 10-325 MG tablet Take 1 tablet by mouth 4 (four) times daily as needed.   Yes [provider]  insulin lispro (HUMALOG) 100 UNIT/ML KwikPen See admin instructions. Use per sliding scale three to four times daily as directed. 11/13/19  Yes [provider]  simvastatin (ZOCOR) 20 MG tablet Take 20 mg by mouth every morning. 09/30/13  Yes [provider]  traZODone (DESYREL) 50 MG tablet Take 75 mg by mouth at bedtime as needed. 02/11/24  Yes [provider]  TRESIBA FLEXTOUCH 100 UNIT/ML FlexTouch Pen Inject 12 Units into the skin at bedtime. 11/13/19  Yes [provider]    Current Facility-Administered Medications  Medication Dose Route Frequency Provider Last Rate Last Admin   acetaminophen (TYLENOL) tablet 650 mg  650 mg Oral Q6H PRN Seena Marsa NOVAK, MD       Or   acetaminophen (TYLENOL) suppository 650 mg  650 mg Rectal Q6H PRN Seena Marsa NOVAK, MD       aspirin EC tablet 81 mg  81 mg Oral Daily Melvin, Alexander B, MD   81 mg at 07/19/24 0816   glucagon (human recombinant) (GLUCAGEN) injection 1 mg  1 mg Intravenous PRN Amin, Ankit C, MD       hydrALAZINE (APRESOLINE) injection 10 mg  10 mg Intravenous Q4H PRN Amin, Ankit C, MD       hydrALAZINE (APRESOLINE) tablet 25 mg  25 mg Oral Q8H Roberts, Lindsay B, NP       HYDROcodone-acetaminophen (NORCO) 10-325 MG per tablet 1 tablet  1 tablet Oral Q6H PRN Amin, Ankit C, MD   1 tablet at 07/19/24 2301   insulin aspart (novoLOG) injection 0-15 Units  0-15 Units Subcutaneous Q4H Amin, Ankit C, MD   5 Units at 07/20/24 0045   insulin glargine-yfgn (SEMGLEE) injection 12 Units  12 Units Subcutaneous QHS Amin, Ankit C, MD   12 Units at 07/19/24 2120   ipratropium-albuterol  (DUONEB) 0.5-2.5 (3) MG/3ML nebulizer solution 3 mL  3 mL Nebulization Q4H PRN Amin, Ankit C, MD       iron sucrose (VENOFER) 400 mg in sodium chloride  0.9 % 250 mL IVPB  400 mg Intravenous Once Amin, Ankit C, MD       metoprolol succinate (TOPROL-XL) 24 hr tablet 25 mg  25 mg Oral Daily Henry Shaver B, NP       metoprolol tartrate (LOPRESSOR) injection 5 mg  5 mg Intravenous Q4H PRN Amin, Ankit C, MD       nitroGLYCERIN 50 mg in dextrose 5 % 250 mL (0.2 mg/mL) infusion  0-200 mcg/min Intravenous Continuous Henry Shaver B, NP 36 mL/hr at 07/20/24 0721 120 mcg/min at 07/20/24 0721    ondansetron (ZOFRAN) injection 4 mg  4 mg Intravenous Q6H PRN Amin, Ankit C, MD       pantoprazole (PROTONIX) EC tablet 40 mg  40 mg Oral BID Henry Shaver B, NP       polyethylene glycol (MIRALAX / GLYCOLAX) packet 17 g  17 g Oral Daily PRN Seena Marsa NOVAK, MD       potassium chloride SA (KLOR-CON M) CR tablet 20 mEq  20 mEq Oral Once Cyndy Ozell DASEN, RPH       senna-docusate (Senokot-S) tablet 2 tablet  2 tablet Oral QHS Amin, Ankit C, MD       simvastatin (ZOCOR) tablet 20 mg  20 mg Oral q morning Seena Marsa NOVAK, MD   20 mg at 07/19/24 9183   sodium chloride  flush (NS) 0.9 % injection 3 mL  3 mL Intravenous Q12H Seena Marsa NOVAK, MD   3 mL at 07/19/24 2123   traZODone (DESYREL) tablet 75 mg  75 mg Oral QHS PRN Seena Marsa NOVAK, MD   75 mg at 07/19/24 2301    Allergies as of 07/17/2024   (No Known Allergies)    Social History   Socioeconomic History   Marital status: Married    Spouse name: Not on file   Number of children: Not on file   Years of education: Not on file   Highest education level: Not on file  Occupational History   Not on file  Tobacco Use   Smoking status: Former    Types: Cigarettes   Smokeless tobacco: Never  Substance and Sexual Activity   Alcohol use: Not Currently   Drug use: Never   Sexual activity: Not on file  Other Topics Concern   Not on file  Social History Narrative   Not on file   Social Drivers of Health   Financial Resource Strain: Not on file  Food Insecurity: No Food Insecurity (07/18/2024)   Hunger Vital Sign    Worried About Running Out of Food in the Last Year: Never true    Ran Out of Food in the Last Year: Never true  Transportation Needs: No Transportation Needs (07/18/2024)   PRAPARE - Administrator, Civil Service (Medical): No    Lack of Transportation (Non-Medical): No  Physical Activity: Not on file  Stress: Not on file  Social Connections: Moderately Isolated (07/18/2024)   Social  Connection and Isolation Panel    Frequency of Communication with Friends and Family: More than three times a week    Frequency of Social Gatherings with Friends and Family: More than three times a week    Attends Religious Services: Never    Database Administrator or Organizations: No    Attends Banker Meetings: Never    Marital Status: Married  Catering Manager Violence: Not At Risk (07/18/2024)   Humiliation, Afraid, Rape, and Kick questionnaire    Fear of Current or Ex-Partner: No    Emotionally Abused: No    Physically Abused: No    Sexually Abused: No     Code Status   Code Status: Full Code  Review of Systems: All systems reviewed and negative except where noted in HPI.  Physical Exam: Vital signs in last 24 hours: Temp:  [97.8 F (36.6 C)-98.2 F (36.8 C)] 98.2 F (36.8 C) (11/13 0735) Pulse Rate:  [72-85] 72 (11/13 0735) Resp:  [16-19] 17 (11/13 0735) BP: (124-147)/(53-68) 147/68 (11/13 0735) SpO2:  [97 %] 97 % (11/12 1500) Weight:  [72.3 kg] 72.3 kg (11/13  9561) Last BM Date : 07/14/24  General:  Pleasant male in NAD Psych:  Cooperative. Normal mood and affect Eyes: Pupils equal Ears:  Normal auditory acuity Nose: No deformity, discharge or lesions Neck:  Supple, no masses felt Lungs:  Clear to auscultation.  Heart:  Regular rate, regular rhythm.  Abdomen:  Soft, nondistended, nontender, active bowel sounds, no masses felt Rectal :  Deferred Msk: Symmetrical without gross deformities.  Neurologic:  Alert, oriented, grossly normal neurologically Extremities : No edema Skin:  Intact without significant lesions.    Intake/Output from previous day: 11/12 0701 - 11/13 0700 In: 2288.8 [I.V.:2288.8] Out: 700 [Urine:700] Intake/Output this shift:  No intake/output data recorded.   Vina Dasen, NP-C   07/20/2024, 8:46 AM

## 2024-07-20 NOTE — Progress Notes (Addendum)
 Progress Note  Patient Name: Oscar Moses Date of Encounter: 07/20/2024 [facility] Cardiologist: None   Interval Summary    Continues to feel weak, no chest pain. Asked again regarding bleeding PTA, reports he has had some dark stools and even some blood at times the weeks leading up to admission.   Vital Signs Vitals:   07/19/24 1500 07/19/24 2028 07/20/24 0015 07/20/24 0438  BP: (!) 129/53 124/62 (!) 144/66 131/63  Pulse: 72 76 74 85  Resp: 17 19 16 19   Temp: 97.8 F (36.6 C) 98 F (36.7 C) 97.9 F (36.6 C) 97.9 F (36.6 C)  TempSrc: Oral Oral Oral Oral  SpO2: 97%     Weight:    72.3 kg  Height:        Intake/Output Summary (Last 24 hours) at 07/20/2024 0718 Last data filed at 07/20/2024 0655 Gross per 24 hour  Intake 2288.77 ml  Output 700 ml  Net 1588.77 ml      07/20/2024    4:38 AM 07/19/2024    4:41 AM 07/18/2024   12:25 PM  Last 3 Weights  Weight (lbs) 159 lb 4.8 oz 157 lb 8 oz 156 lb 14.4 oz  Weight (kg) 72.258 kg 71.442 kg 71.169 kg      Telemetry/ECG   Sinus Rhythm - Personally Reviewed  Physical Exam  GEN: No acute distress.   Neck: No JVD Cardiac: RRR, no murmurs, rubs, or gallops.  Respiratory: Clear to auscultation bilaterally. GI: Soft, nontender, non-distended  MS: No edema  Assessment & Plan   73 y.o. male with a hx of hx of hypertension, hyperlipidemia, T1DM with retinopathy and nephropathy, chronic anemia, depression, COPD, celiac disease, insomnia, GERD, osteoarthritis, and hx of tobacco use who was seen 07/18/2024 for the evaluation of UA at the request of [name].    Unstable angina -- Reported intermittent episodes of angina over the past several months -- hsTn 31>>26, EKG with no acute ST/T wave abnormalities -- Started on IV heparin, aspirin, statin -- Initially planned for right and left heart cath but creatinine increased from 1.4 to 2.24 yesterday and cath deferred. No further episodes of chest pain  since admission. Now with worsening Hgb, will stop IV heparin.    Acute diastolic CHF -- Presents with worsening shortness of breath over the past several weeks -- proBNP 2857, chest x-ray with right basilar opacities -- Received IV Lasix 40 mg times once in the ED, net -1.9L -- Initially started on spironolactone, but held in the setting of rise in Cr -- Lactic 3.2>>1.8 -- Echo 11/12 LVEF 55-60%, normal RV, small pericardial effusion, moderate LA enlargement -- seems volume stable on exam, defer additional lasix for now  Paroxsymal atrial fibrillation -- Developed episode of atrial fibrillation with RVR while in the ED and then recurrent episode last evening with conversion to sinus rhythm spontaneously -- No further episodes, will resume metoprolol XL 25mg  daily    Hypertensive urgency -- Significantly elevated in the 200s systolic initially. -- Much improved, remains on IV nitroglycerin. Will wean this today  -- Home ACE inhibitor held at this time -- resume metoprolol XL 25mg  daily, add hydralazine 25mg  TID   Hyperlipidemia -- 06/2024 shows LDL 37, HDL 51 -- Continue Zocor 20 mg   Anemia -- Baseline hemoglobin around 13, noted at 10.6 on admission, down to 7.8 today -- Iron panel ok -- initial reports with no bleeding, but on further questioning does report dark stools PTA and some  blood noted at times -- consider GI consult   AKI -- Cr 1.4>>2.24>>1.9 today -- spiro stopped yesterday, ACEi held  For questions or updates, please contact [facility] Please consult [email or web address] for contact info under   Signed, Manuelita Rummer, NP   Oscar Moses was seen by me today along with Manuelita Rummer, NP. I have personally performed an evaluation on this patient.  My findings are as follows:  73 y.o. male with history of HTN, HLD, DM, COPD, former tobacco abuse admitted with chest pain, dyspnea and uncontrolled HTN. He was found to have mild elevation of troponin and  volume overload with pleural effusions noted on CT scan. He was started on IV Lasix. He did have a brief run of atrial fib while being observed in the ED but was in sinus when he was seen on 07/18/24.   Still feeling weak today.   Data: EKG(s) and pertinent labs, studies, etc were personally reviewed and interpreted by me:  NO EKG today I have personally reviewed the telemetry: sinus I have personally reviewed the lab data Otherwise, I agree with data as outlined by the advanced practice provider.  Exam performed by me:  Gen: NAD Neck: No JVD Cardiac: RRR without a murmur Lungs: clear bilaterally Extremities: no LE edema  My Assessment and Plan:   Unstable angina: Mild elevation troponin. Chest pain on admission. We have delayed his cardiac cath due to his anemia and renal failure. Lasix on hold.  -Stop Heparin with anemia, GI blood loss.  Continue ASA and statin     PAF: Sinus today but atrial fib last night. Resume metoprolol  Hypertensive urgency: BP better controlled now on IV NTG. Wean NTG today. Add Hydralazine. Avoid Ace-inh with renal insufficiency.    Acute CHF: Elevated BNP. Pleural effusion noted on chest CT. Worsened renal function with diuresis. Lasix on hold. LV and RV function norma on echo 07/19/24.    Anemia: Hgb down from 10.6 to 7.7. Suggest GI consult. Stopping heparin.   Signed,  Lonni Cash, MD  07/20/2024 9:38 AM

## 2024-07-21 ENCOUNTER — Encounter (HOSPITAL_COMMUNITY)
Admission: EM | Disposition: A | Discharge status: Home / Self Care | Payer: Self-pay | Source: Ambulatory Visit | Attending: Family Medicine

## 2024-07-21 ENCOUNTER — Inpatient Hospital Stay (HOSPITAL_COMMUNITY): Admitting: Certified Registered"

## 2024-07-21 ENCOUNTER — Encounter (HOSPITAL_COMMUNITY): Payer: Self-pay | Admitting: Hospitalist

## 2024-07-21 DIAGNOSIS — I214 Non-ST elevation (NSTEMI) myocardial infarction: Secondary | ICD-10-CM | POA: Diagnosis not present

## 2024-07-21 DIAGNOSIS — K2289 Other specified disease of esophagus: Secondary | ICD-10-CM | POA: Diagnosis not present

## 2024-07-21 DIAGNOSIS — I509 Heart failure, unspecified: Secondary | ICD-10-CM | POA: Diagnosis not present

## 2024-07-21 DIAGNOSIS — K3189 Other diseases of stomach and duodenum: Secondary | ICD-10-CM | POA: Diagnosis not present

## 2024-07-21 DIAGNOSIS — Z87891 Personal history of nicotine dependence: Secondary | ICD-10-CM

## 2024-07-21 DIAGNOSIS — I209 Angina pectoris, unspecified: Secondary | ICD-10-CM

## 2024-07-21 DIAGNOSIS — D509 Iron deficiency anemia, unspecified: Secondary | ICD-10-CM | POA: Diagnosis not present

## 2024-07-21 DIAGNOSIS — I200 Unstable angina: Secondary | ICD-10-CM | POA: Diagnosis not present

## 2024-07-21 DIAGNOSIS — I10 Essential (primary) hypertension: Secondary | ICD-10-CM | POA: Diagnosis not present

## 2024-07-21 DIAGNOSIS — I480 Paroxysmal atrial fibrillation: Secondary | ICD-10-CM | POA: Diagnosis not present

## 2024-07-21 DIAGNOSIS — K449 Diaphragmatic hernia without obstruction or gangrene: Secondary | ICD-10-CM

## 2024-07-21 DIAGNOSIS — K921 Melena: Secondary | ICD-10-CM | POA: Diagnosis not present

## 2024-07-21 HISTORY — PX: ESOPHAGOGASTRODUODENOSCOPY: SHX5428

## 2024-07-21 LAB — GLUCOSE, CAPILLARY
Glucose-Capillary: 103 mg/dL — ABNORMAL HIGH (ref 70–99)
Glucose-Capillary: 220 mg/dL — ABNORMAL HIGH (ref 70–99)
Glucose-Capillary: 303 mg/dL — ABNORMAL HIGH (ref 70–99)
Glucose-Capillary: 279 mg/dL — ABNORMAL HIGH (ref 70–99)
Glucose-Capillary: 100 mg/dL — ABNORMAL HIGH (ref 70–99)
Glucose-Capillary: 95 mg/dL (ref 70–99)
Glucose-Capillary: 206 mg/dL — ABNORMAL HIGH (ref 70–99)

## 2024-07-21 LAB — HEMOGLOBIN AND HEMATOCRIT, BLOOD
HCT: 29.8 % — ABNORMAL LOW (ref 39.0–52.0)
HCT: 27.5 % — ABNORMAL LOW (ref 39.0–52.0)
Hemoglobin: 10 g/dL — ABNORMAL LOW (ref 13.0–17.0)
Hemoglobin: 9.3 g/dL — ABNORMAL LOW (ref 13.0–17.0)

## 2024-07-21 LAB — URINALYSIS, ROUTINE W REFLEX MICROSCOPIC
Bacteria, UA: NONE SEEN
Bilirubin Urine: NEGATIVE
Glucose, UA: 50 mg/dL — AB
Hgb urine dipstick: NEGATIVE
Ketones, ur: NEGATIVE mg/dL
Leukocytes,Ua: NEGATIVE
Nitrite: NEGATIVE
Protein, ur: >=300 mg/dL — AB
Specific Gravity, Urine: 1.009 (ref 1.005–1.030)
pH: 6 (ref 5.0–8.0)

## 2024-07-21 LAB — CBC
HCT: 23.7 % — ABNORMAL LOW (ref 39.0–52.0)
Hemoglobin: 7.9 g/dL — ABNORMAL LOW (ref 13.0–17.0)
MCH: 32.2 pg (ref 26.0–34.0)
MCHC: 33.3 g/dL (ref 30.0–36.0)
MCV: 96.7 fL (ref 80.0–100.0)
Platelets: 243 K/uL (ref 150–400)
RBC: 2.45 MIL/uL — ABNORMAL LOW (ref 4.22–5.81)
RDW: 12.4 % (ref 11.5–15.5)
WBC: 5 K/uL (ref 4.0–10.5)
nRBC: 0 % (ref 0.0–0.2)

## 2024-07-21 LAB — BASIC METABOLIC PANEL WITH GFR
Anion gap: 9 (ref 5–15)
BUN: 18 mg/dL (ref 8–23)
CO2: 25 mmol/L (ref 22–32)
Calcium: 8.2 mg/dL — ABNORMAL LOW (ref 8.9–10.3)
Chloride: 105 mmol/L (ref 98–111)
Creatinine, Ser: 1.53 mg/dL — ABNORMAL HIGH (ref 0.61–1.24)
GFR, Estimated: 48 mL/min — ABNORMAL LOW (ref >60)
Glucose, Bld: 104 mg/dL — ABNORMAL HIGH (ref 70–99)
Potassium: 4.6 mmol/L (ref 3.5–5.1)
Sodium: 139 mmol/L (ref 135–145)

## 2024-07-21 LAB — ABO/RH: ABO/RH(D): O POS

## 2024-07-21 LAB — PREPARE RBC (CROSSMATCH)

## 2024-07-21 SURGERY — EGD (ESOPHAGOGASTRODUODENOSCOPY)
Anesthesia: Monitor Anesthesia Care

## 2024-07-21 MED ORDER — NA SULFATE-K SULFATE-MG SULF 17.5-3.13-1.6 GM/177ML PO SOLN
0.5000 | Freq: Once | ORAL | Status: AC
  Administered 2024-07-22: 177 mL via ORAL

## 2024-07-21 MED ORDER — PROPOFOL 10 MG/ML IV BOLUS
INTRAVENOUS | Status: DC | PRN
Start: 2024-07-21 — End: 2024-07-21
  Administered 2024-07-21 (×5): 20 mg via INTRAVENOUS
  Administered 2024-07-21: 10 mg via INTRAVENOUS
  Administered 2024-07-21 (×2): 20 mg via INTRAVENOUS
  Administered 2024-07-21 (×3): 10 mg via INTRAVENOUS
  Administered 2024-07-21 (×2): 20 mg via INTRAVENOUS

## 2024-07-21 MED ORDER — POLYETHYLENE GLYCOL 3350 17 G PO PACK
17.0000 g | PACK | Freq: Every day | ORAL | Status: DC
  Administered 2024-07-25: 17 g via ORAL
  Filled 2024-07-21 (×3): qty 1

## 2024-07-21 MED ORDER — METOPROLOL SUCCINATE ER 25 MG PO TB24
25.0000 mg | ORAL_TABLET | Freq: Once | ORAL | Status: AC
  Administered 2024-07-21: 25 mg via ORAL
  Filled 2024-07-21: qty 1

## 2024-07-21 MED ORDER — SODIUM CHLORIDE 0.9 % IV SOLN
INTRAVENOUS | Status: AC | PRN
Start: 2024-07-21 — End: 2024-07-21
  Administered 2024-07-21: 500 mL via INTRAMUSCULAR

## 2024-07-21 MED ORDER — SODIUM CHLORIDE 0.9% IV SOLUTION: Freq: Once | INTRAVENOUS | Status: AC

## 2024-07-21 MED ORDER — HYDRALAZINE HCL 50 MG PO TABS
100.0000 mg | ORAL_TABLET | Freq: Three times a day (TID) | ORAL | Status: DC
  Administered 2024-07-21 – 2024-07-25 (×13): 100 mg via ORAL
  Filled 2024-07-21 (×13): qty 2

## 2024-07-21 MED ORDER — HYDROCODONE-ACETAMINOPHEN 10-325 MG PO TABS
1.0000 | ORAL_TABLET | ORAL | Status: DC | PRN
  Administered 2024-07-21 – 2024-07-24 (×9): 1 via ORAL
  Filled 2024-07-21 (×9): qty 1

## 2024-07-21 MED ORDER — SIMETHICONE 80 MG PO CHEW
240.0000 mg | CHEWABLE_TABLET | Freq: Once | ORAL | Status: AC
  Administered 2024-07-22: 240 mg via ORAL
  Filled 2024-07-21: qty 3

## 2024-07-21 MED ORDER — POLYETHYLENE GLYCOL 3350 17 G PO PACK
34.0000 g | PACK | Freq: Once | ORAL | Status: AC
  Administered 2024-07-21: 34 g via ORAL
  Filled 2024-07-21: qty 2

## 2024-07-21 MED ORDER — NA SULFATE-K SULFATE-MG SULF 17.5-3.13-1.6 GM/177ML PO SOLN
0.5000 | Freq: Once | ORAL | Status: AC
  Administered 2024-07-21: 177 mL via ORAL
  Filled 2024-07-21 (×2): qty 1

## 2024-07-21 MED ORDER — SIMETHICONE 80 MG PO CHEW
240.0000 mg | CHEWABLE_TABLET | Freq: Once | ORAL | Status: AC
  Administered 2024-07-21: 240 mg via ORAL
  Filled 2024-07-21: qty 3

## 2024-07-21 MED ORDER — METOPROLOL SUCCINATE ER 50 MG PO TB24
50.0000 mg | ORAL_TABLET | Freq: Every day | ORAL | Status: DC
  Administered 2024-07-22 – 2024-07-25 (×4): 50 mg via ORAL
  Filled 2024-07-21 (×4): qty 1

## 2024-07-21 NOTE — Transfer of Care (Signed)
 Immediate Anesthesia Transfer of Care Note  Patient: Oscar Moses  Procedure(s) Performed: EGD (ESOPHAGOGASTRODUODENOSCOPY)  Patient Location: PACU  Anesthesia Type:MAC  Level of Consciousness: drowsy  Airway & Oxygen Therapy: Patient Spontanous Breathing and Patient connected to face mask oxygen  Post-op Assessment: Report given to RN and Post -op Vital signs reviewed and stable  Post vital signs: Reviewed and stable  Last Vitals:  Vitals Value Taken Time  BP    Temp    Pulse    Resp    SpO2      Last Pain:  Vitals:   07/21/24 0726  TempSrc: Temporal  PainSc: 6       Patients Stated Pain Goal: 0 (07/20/24 1941)  Complications: No notable events documented.

## 2024-07-21 NOTE — Plan of Care (Signed)

## 2024-07-21 NOTE — Progress Notes (Signed)
 Progress Note  Patient Name: Oscar Moses Date of Encounter: 07/21/2024 [facility] Cardiologist: None   Interval Summary    No chest pain or dyspnea this am  Vital Signs Vitals:   07/21/24 0900 07/21/24 1020 07/21/24 1120 07/21/24 1128  BP: (!) 169/82 (!) 161/79 (!) 168/65 (!) 169/79  Pulse: 91 80 (!) 46 85  Resp: (!) 24  14 16   Temp:   97.8 F (36.6 C) 98.6 F (37 C)  TempSrc:   Oral Oral  SpO2: 96%  95% 94%  Weight:      Height:        Intake/Output Summary (Last 24 hours) at 07/21/2024 1134 Last data filed at 07/21/2024 0500 Gross per 24 hour  Intake 620 ml  Output 1545 ml  Net -925 ml      07/21/2024    7:26 AM 07/21/2024    4:02 AM 07/20/2024    4:38 AM  Last 3 Weights  Weight (lbs) 157 lb 157 lb 11.2 oz 159 lb 4.8 oz  Weight (kg) 71.215 kg 71.532 kg 72.258 kg      aspirin EC  81 mg Oral Daily   hydrALAZINE  50 mg Oral Q8H   insulin aspart  0-15 Units Subcutaneous Q4H   insulin glargine-yfgn  12 Units Subcutaneous QHS   metoprolol succinate  25 mg Oral Daily   Na Sulfate-K Sulfate-Mg Sulfate concentrate  0.5 kit Oral Once   Followed by   NOREEN ON 07/22/2024] Na Sulfate-K Sulfate-Mg Sulfate concentrate  0.5 kit Oral Once   pantoprazole  40 mg Oral BID   polyethylene glycol  17 g Oral Daily   polyethylene glycol  34 g Oral Once   senna-docusate  2 tablet Oral QHS   simethicone  240 mg Oral Once   Followed by   [START ON 07/22/2024] simethicone  240 mg Oral Once   simvastatin  20 mg Oral q morning   sodium chloride  flush  3 mL Intravenous Q12H    Telemetry/ECG   Sinus - Personally Reviewed  Physical Exam   General: Well developed, well nourished, NAD  SKIN: warm, dry. Neuro: No focal deficits  Psychiatric: Mood and affect normal  Neck: No JVD Lungs:Clear bilaterally, no wheezes, rhonci, crackles Cardiovascular: Regular rate and rhythm. No murmurs, gallops or rubs. Abdomen:Soft.  Extremities: No lower extremity edema.     Assessment & Plan   73 y.o. male with history of HTN, HLD, DM, COPD, former tobacco abuse admitted with chest pain, dyspnea and uncontrolled HTN. He was found to have mild elevation of troponin and volume overload with pleural effusions noted on CT scan. He was started on IV Lasix but had worsened renal function with diuresis. He did have a brief run of atrial fib while being observed in the ED and had one brief run of atrial fib on 07/19/24. He has had worsened anemia. Cardiac workup delayed for now pending GI workup.   My Assessment and Plan:  Unstable angina: Mild elevation troponin. Chest pain on admission. We have delayed his cardiac cath due to his anemia and renal failure. Lasix has been stopped.  - Continue ASA and statin.      PAF: Sinus today.  -Continue metoprolol.   Hypertensive urgency: BP is still elevated. Wean NTG to off. Will increase Toprol to 50 mg daily and increase hydralazine to 100 mg po TID. Avoid Ace-inh with renal insufficiency.    Acute CHF: Elevated BNP. Pleural effusion noted on chest CT. Worsened renal  function with diuresis. Lasix on hold. LV and RV function normal on echo 07/19/24. He does not appear volume overloaded   Anemia: GI workup in progress  Signed,  Lonni Cash, MD  07/21/2024 11:34 AM

## 2024-07-21 NOTE — Anesthesia Preprocedure Evaluation (Signed)
 Anesthesia Evaluation  Patient identified by MRN, date of birth, ID band Patient awake    Reviewed: Allergy & Precautions, NPO status , Patient's Chart, lab work & pertinent test results, reviewed documented beta blocker date and time   Airway Mallampati: II  TM Distance: >3 FB     Dental  (+) Edentulous Upper, Upper Dentures, Partial Lower   Pulmonary COPD,  COPD inhaler and oxygen dependent, former smoker   breath sounds clear to auscultation + decreased breath sounds      Cardiovascular hypertension, Pt. on medications + angina at rest + Past MI  Normal cardiovascular exam Rhythm:Regular Rate:Normal  Unstable angina on NTG gtt  EKG NSR with PVC's  Echo 07/19/24 1. Left ventricular ejection fraction, by estimation, is 55 to 60%. The  left ventricle has normal function. The left ventricle has no regional  wall motion abnormalities. Left ventricular diastolic parameters are  consistent with Grade I diastolic  dysfunction (impaired relaxation).   2. Right ventricular systolic function is normal. The right ventricular  size is normal.   3. Left atrial size was moderately dilated.   4. Right atrial size was mildly dilated.   5. A small pericardial effusion is present. There is no evidence of  cardiac tamponade.   6. The mitral valve is normal in structure. Mild mitral valve  regurgitation. No evidence of mitral stenosis.   7. The aortic valve is tricuspid. Aortic valve regurgitation is not  visualized. No aortic stenosis is present.   8. The inferior vena cava is dilated in size with <50% respiratory  variability, suggesting right atrial pressure of 15 mmHg.      Neuro/Psych  PSYCHIATRIC DISORDERS  Depression    negative neurological ROS     GI/Hepatic Neg liver ROS,GERD  Medicated,,Dark stools    Endo/Other  diabetes, Well Controlled, Type 1, Insulin Dependent    Renal/GU Renal InsufficiencyRenal diseaseLab  Results      Component                Value               Date                      NA                       139                 07/21/2024                CL                       105                 07/21/2024                K                        4.6                 07/21/2024                CO2                      25                  07/21/2024  BUN                      18                  07/21/2024                CREATININE               1.53 (H)            07/21/2024                GFRNONAA                 48 (L)              07/21/2024                CALCIUM                  8.2 (L)             07/21/2024                PHOS                     4.9 (H)             07/20/2024                ALBUMIN                  2.4 (L)             07/19/2024                GLUCOSE                  104 (H)             07/21/2024             negative genitourinary   Musculoskeletal negative musculoskeletal ROS (+)    Abdominal   Peds  Hematology  (+) Blood dyscrasia, anemia Lab Results      Component                Value               Date                      WBC                      5.0                 07/21/2024                HGB                      7.9 (L)             07/21/2024                HCT                      23.7 (L)            07/21/2024                MCV  96.7                07/21/2024                PLT                      243                 07/21/2024              Anesthesia Other Findings   Reproductive/Obstetrics                              Anesthesia Physical Anesthesia Plan  ASA: 4  Anesthesia Plan: MAC   Post-op Pain Management: Minimal or no pain anticipated   Induction: Intravenous  PONV Risk Score and Plan: 1 and Treatment may vary due to age or medical condition and Propofol infusion  Airway Management Planned: Natural Airway, Simple Face Mask and Nasal Cannula  Additional  Equipment: None  Intra-op Plan:   Post-operative Plan:   Informed Consent: I have reviewed the patients History and Physical, chart, labs and discussed the procedure including the risks, benefits and alternatives for the proposed anesthesia with the patient or authorized representative who has indicated his/her understanding and acceptance.     Dental advisory given  Plan Discussed with: CRNA and Anesthesiologist  Anesthesia Plan Comments:          Anesthesia Quick Evaluation

## 2024-07-21 NOTE — Progress Notes (Signed)
 PROGRESS NOTE    Oscar Moses  FMW:[id number] DOB: 06/27/1951 DOA: 07/17/2024 PCP: Oscar Norleen, MD  Chief Complaint  Patient presents with   Shortness of Breath    Brief Narrative:   69 male HTN, HLD, CKD 2, GERD, DM2, Depression, celiac disease, COPD, anemia, low back pain admitted for shortness of breath.  Upon admission noted to have signs of volume overload with moderate bilateral pleural effusion/small pericardial effusion.  No PE seen on the CTA.  Cardiology was consulted and cautiously diuresing due to AKI. Due to persistent anemia, GI team consulted for their input as patient will need to be on anticoagulation.  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  Overall troponins are flat but there is concerns of ACS.  Plans for RHC/LHC but on hold due to AKI and anemia. - Echocardiogram with preserved ef, no RWMA. - lasix on hold - will repeat chest imaging in Oscar Moses few days to follow effusion -A1c 9.6, LDL 31   Anemia Hb fluctuating Reported dark stools Considered transfusion this AM, but repeat Hb was 10  GI eval underway - s/p EGD with possible [place] segment of barrett's esophagus, duodenal mucosal changes concerning for celiac disease.  No cause ofr anemia, bleeding noted.  Plan for colonoscopy tmrw. Anticoagulation currently on hold   AKI Creatinine peaked  at 2.24 Creatinine 1.5 today Will continue to trend Follow UA If renal function not improving, will get renal US    Hypertension ?Hypertension emergency Initial systolic blood pressure greater than 200 therefore started on nitroglycerin drip.  Blood pressure has now stabilized.  Wean nitroglycerin as tolerated. Toprol-XL, Aldactone.   Atrial fibrillation, new onset Seen by cardiology, stopping anticoagulation for now in setting of anemia above   Insulin-dependent diabetes mellitus type 2, uncontrolled due to hyperglycemia -basal and SSI - adjust prn    Hyperlipidemia -simvastatin   Prolonged QTc -Resolved  Celiac disease - Noted   COPD -As needed bronchodilators - no si/sx exacerbation      DVT prophylaxis: SCD Code Status: full Family Communication: none Disposition:   Status is: Inpatient Remains inpatient appropriate because: need for continued inpatient care   Consultants:  cardiology  Procedures:  EGD 11/14  Echo IMPRESSIONS     1. Left ventricular ejection fraction, by estimation, is 55 to 60%. The  left ventricle has normal function. The left ventricle has no regional  wall motion abnormalities. Left ventricular diastolic parameters are  consistent with Grade I diastolic  dysfunction (impaired relaxation).   2. Right ventricular systolic function is normal. The right ventricular  size is normal.   3. Left atrial size was moderately dilated.   4. Right atrial size was mildly dilated.   5. Oscar Moses small pericardial effusion is present. There is no evidence of  cardiac tamponade.   6. The mitral valve is normal in structure. Mild mitral valve  regurgitation. No evidence of mitral stenosis.  7. The aortic valve is tricuspid. Aortic valve regurgitation is not  visualized. No aortic stenosis is present.   8. The inferior vena cava is dilated in size with <50% respiratory  variability, suggesting right atrial pressure of 15 mmHg.   Antimicrobials:   Anti-infectives (From admission, onward)    None       Subjective: C/o headache General malaise  Objective: Vitals:   07/21/24 1303 07/21/24 1355 07/21/24 1400 07/21/24 1640  BP: (!) 170/82 (!) 165/81 (!) 165/81 (!) 175/83  Pulse: 79 75  (!) 44  Resp:    16  Temp:  98.2 F (36.8 C)  98.1 F (36.7 C)  TempSrc:  Oral  Oral  SpO2:    97%  Weight:      Height:        Intake/Output Summary (Last 24 hours) at 07/21/2024 1906 Last data filed at 07/21/2024 1642 Gross per 24 hour  Intake 860 ml  Output 1690 ml  Net -830 ml   Filed Weights   07/20/24 0438 07/21/24 0402 07/21/24 0726  Weight: 72.3 kg 71.5 kg 71.2 kg    Examination:  General exam: Appears calm and comfortable  Respiratory system: unlabored Cardiovascular system: RRR Gastrointestinal system: Abdomen is nondistended, soft and nontender.  Central nervous system: Alert and oriented. No focal neurological deficits. Extremities: no LEE    Data Reviewed: I have personally reviewed following labs and imaging studies  CBC: Recent Labs  Lab 07/17/24 1442 07/18/24 1406 07/19/24 0420 07/19/24 1746 07/20/24 0351 07/20/24 1547 07/21/24 0308 07/21/24 1608 07/21/24 1818  WBC 6.5 6.7 5.8  --  5.7  --  5.0  --   --   HGB 10.1* 8.9* 8.0*   < > 7.8* 8.7* 7.9* 10.0* 9.3*  HCT 29.8* 26.5* 23.7*   < > 22.7* 26.0* 23.7* 29.8* 27.5*  MCV 96.1 94.6 96.3  --  96.2  --  96.7  --   --   PLT 257 295 238  --  225  --  243  --   --    < > = values in this interval not displayed.    Basic Metabolic Panel: Recent Labs  Lab 07/17/24 1129 07/17/24 1442 07/18/24 1406 07/19/24 0420 07/20/24 0351 07/21/24 0308  NA 141  --  137 134* 137 139  K 4.5  --  3.8 4.6 3.7 4.6  CL 103  --  103 98 103 105  CO2 27  --  24 24 25 25   GLUCOSE 139*  --  151* 432* 91 104*  BUN 18  --  16 23 26* 18  CREATININE 1.02 1.00 1.49* 2.24* 1.97* 1.53*  CALCIUM 9.5  --  8.2* 7.7* 7.9* 8.2*  MG  --   --  1.9  --  2.2  --   PHOS  --   --    --   --  4.9*  --     GFR: Estimated Creatinine Clearance: 43 mL/min (Oscar Moses) (by C-G formula based on SCr of 1.53 mg/dL (H)).  Liver Function Tests: Recent Labs  Lab 07/17/24 1443 07/18/24 1406 07/19/24 0420  AST 46* 27 21  ALT 33 25 21  ALKPHOS 102 60 60  BILITOT 0.5 0.5 0.7  PROT 6.9 5.9* 5.3*  ALBUMIN 3.6 2.8* 2.4*    CBG: Recent Labs  Lab 07/21/24 0736 07/21/24 0936 07/21/24 1118 07/21/24 1356 07/21/24 1638  GLUCAP 220* 303* 279* 100* 95     No results found for this or any previous  visit (from the past 240 hours).       Radiology Studies: No results found.      Scheduled Meds:  aspirin EC  81 mg Oral Daily   hydrALAZINE  100 mg Oral Q8H   insulin aspart  0-15 Units Subcutaneous Q4H   insulin glargine-yfgn  12 Units Subcutaneous QHS   [START ON 07/22/2024] metoprolol succinate  50 mg Oral Daily   Na Sulfate-K Sulfate-Mg Sulfate concentrate  0.5 kit Oral Once   Followed by   NOREEN ON 07/22/2024] Na Sulfate-K Sulfate-Mg Sulfate concentrate  0.5 kit Oral Once   pantoprazole  40 mg Oral BID   polyethylene glycol  17 g Oral Daily   senna-docusate  2 tablet Oral QHS   [START ON 07/22/2024] simethicone  240 mg Oral Once   simvastatin  20 mg Oral q morning   sodium chloride  flush  3 mL Intravenous Q12H   Continuous Infusions:  nitroGLYCERIN 60 mcg/min (07/21/24 1157)     LOS: 3 days    Time spent: over 30 min     Meliton Monte, MD [facility]   To contact the attending provider between 7A-7P or the covering provider during after hours 7P-7A, please log into the web site [email or web address] and access using universal [place] password for that web site. If you do not have the password, please call the hospital operator.  07/21/2024, 7:06 PM

## 2024-07-21 NOTE — Anesthesia Postprocedure Evaluation (Signed)
 Anesthesia Post Note  Patient: Oscar Moses  Procedure(s) Performed: EGD (ESOPHAGOGASTRODUODENOSCOPY)     Patient location during evaluation: PACU Anesthesia Type: MAC Level of consciousness: awake and alert and oriented Pain management: pain level controlled Vital Signs Assessment: post-procedure vital signs reviewed and stable Respiratory status: spontaneous breathing, nonlabored ventilation, respiratory function stable and patient connected to nasal cannula oxygen Cardiovascular status: stable and blood pressure returned to baseline Postop Assessment: no apparent nausea or vomiting Anesthetic complications: no   No notable events documented.  Last Vitals:  Vitals:   07/21/24 0842 07/21/24 0845  BP: (!) 143/74   Pulse: 87 86  Resp: 17 (!) 23  Temp: (!) 35.8 C   SpO2: 95% 100%    Last Pain:  Vitals:   07/21/24 0842  TempSrc: Temporal  PainSc: 0-No pain                 Bambi Fehnel A.

## 2024-07-21 NOTE — Interval H&P Note (Signed)
 History and Physical Interval Note: Patient feeling okay today - denies chest pains or shortness of breath. Hgb around 8, no bleeding symptoms, BUN trending down. CArdiology has asked us  to do this procedure to clarify bleeding source and assess rebleeding risk prior to cardiac catheterization. I have spoken with Dr. Verlin of cardiology this AM - he has cleared patient to proceed with anesthesia for this exam. I have discussed risks of this with the patient - namely that of anesthesia and stressing his heart, he understands this risk and wishes to proceed to clarify his bleeding symptoms. Other risks of the procedure to include bowel injury, bleeding,etc, reviewed. All questions answers, he understands the situation, why we are doing it, and risks, and wishes to proceed.  07/21/2024 7:45 AM  Oscar Moses  has presented today for surgery, with the diagnosis of Anemia, dark stool.  The various methods of treatment have been discussed with the patient and family. After consideration of risks, benefits and other options for treatment, the patient has consented to  Procedure(s): EGD (ESOPHAGOGASTRODUODENOSCOPY) (N/A) as a surgical intervention.  The patient's history has been reviewed, patient examined, no change in status, stable for surgery.  I have reviewed the patient's chart and labs.  Questions were answered to the patient's satisfaction.     [name]

## 2024-07-21 NOTE — Op Note (Signed)
 [facility] Patient Name: Oscar Moses Procedure Date : 07/21/2024 MRN: [id number] Attending MD: Elspeth SQUIBB. Leigh , MD, [id number] Date of Birth: Jan 29, 1951 CSN: [id number] Age: 73 Admit Type: Inpatient Procedure:                Upper GI endoscopy Indications:              Dark stools / anemia, on heparin drip for unstable                            angina - pending cardiac cath. EGD to clear upper                            tract. Reported history of celiac disease. Providers:                Elspeth SQUIBB. Leigh, MD, Gregoria Pierce, RN,                            Haskel Chris, Technician Referring MD:              Medicines:                Monitored Anesthesia Care Complications:            No immediate complications. Estimated blood loss:                            None. Estimated Blood Loss:     Estimated blood loss: none. Procedure:                Pre-Anesthesia Assessment:                           - Prior to the procedure, a History and Physical                            was performed, and patient medications and                            allergies were reviewed. The patient's tolerance of                            previous anesthesia was also reviewed. The risks                            and benefits of the procedure and the sedation                            options and risks were discussed with the patient.                            All questions were answered, and informed consent                            was obtained. Prior Anticoagulants: The patient has  taken heparin, last dose was 1 day prior to                            procedure. ASA Grade Assessment: IV - A patient                            with severe systemic disease that is a constant                            threat to life. After reviewing the risks and                            benefits, the patient was deemed in satisfactory                             condition to undergo the procedure.                           After obtaining informed consent, the endoscope was                            passed under direct vision. Throughout the                            procedure, the patient's blood pressure, pulse, and                            oxygen saturations were monitored continuously. The                            [id number]-[id number] ([id number]) Olympus endoscope was introduced                            through the mouth, and advanced to the second part                            of duodenum. The upper GI endoscopy was                            accomplished without difficulty. The patient                            tolerated the procedure well. Scope In: Scope Out: Findings:      Esophagogastric landmarks were identified: the Z-line was found at 40       cm, the gastroesophageal junction was found at 40 cm and the upper       extent of the gastric folds was found at 42 cm from the incisors.      A 2 cm hiatal hernia was present.      The Z-line was irregular. Possible 1cm segment of Barrett's - biopsies       not taken as to not confound the presentation in case he needs capsule       endoscopy.      The exam of the  esophagus was otherwise normal.      The entire examined stomach was normal.      Scalloped mucosa was found in the second portion of the duodenum       consisent with mildly active celiac disease.      The exam of the duodenum was otherwise normal. The duodenal sweep was       markedly angulated and visualization was difficult in this area.       Numerous passes made - no obvious ulcers or pathology noted to cause       symptoms. No heme noted anywhere. Impression:               - Esophagogastric landmarks identified.                           - 2 cm hiatal hernia.                           - Z-line irregular. Possible [place] segment of BE -                            biopsies NOT taken to not confound the presentation                             in case capsule endoscopy is needed                           - Normal stomach.                           - Duodenal mucosal changes seen, suspicious for                            celiac disease.                           - Markedly angulated duodenal sweep making for                            difficult visualization there - multiple passes                            made - no concerning pathology.                           No cause for anemia / bleeding noted on this exam.                            Recommend colonoscopy as next step if the patient                            is willing. Will discuss with the patient's primary                            team. Recommendation:           - Return patient to hospital ward for ongoing  care.                           - Clear liquid diet today with bowel prep tonight,                            if patient is willing, for colonoscopy tomorrow.                           - Continue present medications.                           - Trend Hgb, monitor for bleeding                           - Dr. Rollin to assume inpatient GI care tomorrow Procedure Code(s):        --- Professional ---                           [phone], Esophagogastroduodenoscopy, flexible,                            transoral; diagnostic, including collection of                            specimen(s) by brushing or washing, when performed                            (separate procedure) Diagnosis Code(s):        --- Professional ---                           K44.9, Diaphragmatic hernia without obstruction or                            gangrene                           K22.89, Other specified disease of esophagus                           K31.89, Other diseases of stomach and duodenum                           K92.1, Melena (includes Hematochezia) CPT copyright 2022 [employer]. All rights reserved. The codes documented in this report are preliminary and  upon coder review may  be revised to meet current compliance requirements. Elspeth P. Lexey Fletes, MD 07/21/2024 8:48:57 AM This report has been signed electronically. Number of Addenda: 0

## 2024-07-21 NOTE — Inpatient Diabetes Management (Signed)
 Inpatient Diabetes Program Recommendations  AACE/ADA: New Consensus Statement on Inpatient Glycemic Control   Target Ranges:  Prepandial:   less than 140 mg/dL      Peak postprandial:   less than 180 mg/dL (1-2 hours)      Critically ill patients:  140 - 180 mg/dL   Lab Results  Component Value Date   GLUCAP 303 (H) 07/21/2024   HGBA1C 9.6 (H) 07/17/2024    Latest Reference Range & Units 07/20/24 12:16 07/20/24 16:25 07/20/24 20:37 07/20/24 23:47 07/21/24 03:07 07/21/24 07:36 07/21/24 09:36  Glucose-Capillary 70 - 99 mg/dL 722 (H) 772 (H) 860 (H) 154 (H) 103 (H) 220 (H) 303 (H)   Review of Glycemic Control  Diabetes history: DM1(does not make insulin. Needs correction, basal and meal coverage)   Outpatient Diabetes medications: FreeStyle Libre   Tresiba 12 units every day Novolog 0-20 units TID   Current orders for Inpatient glycemic control:  Tresiba 12 units every day Novolog 0-15 units Q4H   Inpatient Diabetes Program Recommendations:     Noted NPO today.   Please consider increasing Semglee to 15 units daily.   Thanks,  Lavanda Search, RN, MSN, [facility]  Inpatient Diabetes Coordinator  Pager [phone] (8a-5p)

## 2024-07-22 ENCOUNTER — Encounter (HOSPITAL_COMMUNITY): Admission: EM | Disposition: A | Payer: Self-pay | Source: Ambulatory Visit | Attending: Family Medicine

## 2024-07-22 ENCOUNTER — Inpatient Hospital Stay (HOSPITAL_COMMUNITY): Admitting: Anesthesiology

## 2024-07-22 DIAGNOSIS — D509 Iron deficiency anemia, unspecified: Secondary | ICD-10-CM

## 2024-07-22 DIAGNOSIS — E1122 Type 2 diabetes mellitus with diabetic chronic kidney disease: Secondary | ICD-10-CM

## 2024-07-22 DIAGNOSIS — I129 Hypertensive chronic kidney disease with stage 1 through stage 4 chronic kidney disease, or unspecified chronic kidney disease: Secondary | ICD-10-CM

## 2024-07-22 DIAGNOSIS — I161 Hypertensive emergency: Principal | ICD-10-CM

## 2024-07-22 DIAGNOSIS — N182 Chronic kidney disease, stage 2 (mild): Secondary | ICD-10-CM | POA: Diagnosis not present

## 2024-07-22 DIAGNOSIS — R079 Chest pain, unspecified: Secondary | ICD-10-CM | POA: Diagnosis not present

## 2024-07-22 DIAGNOSIS — I214 Non-ST elevation (NSTEMI) myocardial infarction: Secondary | ICD-10-CM | POA: Diagnosis not present

## 2024-07-22 DIAGNOSIS — K635 Polyp of colon: Secondary | ICD-10-CM | POA: Diagnosis not present

## 2024-07-22 DIAGNOSIS — N179 Acute kidney failure, unspecified: Secondary | ICD-10-CM

## 2024-07-22 DIAGNOSIS — D649 Anemia, unspecified: Secondary | ICD-10-CM | POA: Diagnosis not present

## 2024-07-22 HISTORY — PX: POLYPECTOMY: SHX149

## 2024-07-22 HISTORY — PX: HOT HEMOSTASIS: SHX5433

## 2024-07-22 HISTORY — PX: COLONOSCOPY: SHX5424

## 2024-07-22 LAB — BASIC METABOLIC PANEL WITH GFR
Anion gap: 16 — ABNORMAL HIGH (ref 5–15)
BUN: 10 mg/dL (ref 8–23)
CO2: 25 mmol/L (ref 22–32)
Calcium: 8.9 mg/dL (ref 8.9–10.3)
Chloride: 104 mmol/L (ref 98–111)
Creatinine, Ser: 1.3 mg/dL — ABNORMAL HIGH (ref 0.61–1.24)
GFR, Estimated: 58 mL/min — ABNORMAL LOW (ref >60)
Glucose, Bld: 99 mg/dL (ref 70–99)
Potassium: 4 mmol/L (ref 3.5–5.1)
Sodium: 145 mmol/L (ref 135–145)

## 2024-07-22 LAB — CBC
HCT: 28.8 % — ABNORMAL LOW (ref 39.0–52.0)
Hemoglobin: 9.6 g/dL — ABNORMAL LOW (ref 13.0–17.0)
MCH: 32 pg (ref 26.0–34.0)
MCHC: 33.3 g/dL (ref 30.0–36.0)
MCV: 96 fL (ref 80.0–100.0)
Platelets: 305 K/uL (ref 150–400)
RBC: 3 MIL/uL — ABNORMAL LOW (ref 4.22–5.81)
RDW: 12.7 % (ref 11.5–15.5)
WBC: 10 K/uL (ref 4.0–10.5)
nRBC: 0 % (ref 0.0–0.2)

## 2024-07-22 LAB — HEMOGLOBIN AND HEMATOCRIT, BLOOD
HCT: 28.8 % — ABNORMAL LOW (ref 39.0–52.0)
Hemoglobin: 9.6 g/dL — ABNORMAL LOW (ref 13.0–17.0)

## 2024-07-22 LAB — GLUCOSE, CAPILLARY
Glucose-Capillary: 115 mg/dL — ABNORMAL HIGH (ref 70–99)
Glucose-Capillary: 120 mg/dL — ABNORMAL HIGH (ref 70–99)
Glucose-Capillary: 244 mg/dL — ABNORMAL HIGH (ref 70–99)
Glucose-Capillary: 289 mg/dL — ABNORMAL HIGH (ref 70–99)
Glucose-Capillary: 327 mg/dL — ABNORMAL HIGH (ref 70–99)
Glucose-Capillary: 392 mg/dL — ABNORMAL HIGH (ref 70–99)
Glucose-Capillary: 406 mg/dL — ABNORMAL HIGH (ref 70–99)

## 2024-07-22 SURGERY — COLONOSCOPY
Anesthesia: Monitor Anesthesia Care

## 2024-07-22 MED ORDER — PROPOFOL 500 MG/50ML IV EMUL
INTRAVENOUS | Status: DC | PRN
Start: 2024-07-22 — End: 2024-07-22
  Administered 2024-07-22: 180 ug/kg/min via INTRAVENOUS

## 2024-07-22 MED ORDER — SODIUM CHLORIDE 0.9 % IV SOLN: INTRAVENOUS | Status: DC | PRN

## 2024-07-22 MED ORDER — PROPOFOL 10 MG/ML IV BOLUS
INTRAVENOUS | Status: DC | PRN
Start: 2024-07-22 — End: 2024-07-22
  Administered 2024-07-22: 20 mg via INTRAVENOUS

## 2024-07-22 MED ORDER — AMLODIPINE BESYLATE 5 MG PO TABS
5.0000 mg | ORAL_TABLET | Freq: Every day | ORAL | Status: DC
  Administered 2024-07-22 – 2024-07-23 (×2): 5 mg via ORAL
  Filled 2024-07-22 (×2): qty 1

## 2024-07-22 MED ORDER — LIDOCAINE 2% (20 MG/ML) 5 ML SYRINGE
INTRAMUSCULAR | Status: DC | PRN
  Administered 2024-07-22: 40 mg via INTRAVENOUS

## 2024-07-22 NOTE — Op Note (Addendum)
 [facility] Patient Name: Oscar Moses Procedure Date : 07/22/2024 MRN: [id number] Attending MD: Belvie Just , MD, [id number] Date of Birth: April 11, 1951 CSN: [id number] Age: 73 Admit Type: Inpatient Procedure:                Colonoscopy Indications:              Melena, Iron deficiency anemia Providers:                Belvie Just, MD, Willy Hummer, RN, Haskel Chris, Technician Referring MD:              Medicines:                Propofol per Anesthesia Complications:            No immediate complications. Estimated Blood Loss:     Estimated blood loss: none. Procedure:                Pre-Anesthesia Assessment:                           - Prior to the procedure, a History and Physical                            was performed, and patient medications and                            allergies were reviewed. The patient's tolerance of                            previous anesthesia was also reviewed. The risks                            and benefits of the procedure and the sedation                            options and risks were discussed with the patient.                            All questions were answered, and informed consent                            was obtained. Prior Anticoagulants: The patient has                            taken no anticoagulant or antiplatelet agents. ASA                            Grade Assessment: III - A patient with severe                            systemic disease. After reviewing the risks and  benefits, the patient was deemed in satisfactory                            condition to undergo the procedure.                           - Sedation was administered by an anesthesia                            professional. Deep sedation was attained.                           After obtaining informed consent, the colonoscope                            was passed under direct vision.  Throughout the                            procedure, the patient's blood pressure, pulse, and                            oxygen saturations were monitored continuously. The                            CF-HQ190L ([id number]) Olympus colonoscopy was                            introduced through the anus and advanced to the the                            cecum, identified by appendiceal orifice and                            ileocecal valve. The colonoscopy was performed                            without difficulty. The patient tolerated the                            procedure well. The quality of the bowel                            preparation was evaluated using the BBPS [facility]                            Bowel Preparation Scale) with scores of: Right                            Colon = 3 (entire mucosa seen well with no residual                            staining, small fragments of stool or opaque  liquid), Transverse Colon = 3 (entire mucosa seen                            well with no residual staining, small fragments of                            stool or opaque liquid) and Left Colon = 2 (minor                            amount of residual staining, small fragments of                            stool and/or opaque liquid, but mucosa seen well).                            The total BBPS score equals 8. The quality of the                            bowel preparation was good. The ileocecal valve,                            appendiceal orifice, and rectum were photographed. Scope In: 10:30:58 AM Scope Out: 10:50:23 AM Scope Withdrawal Time: 0 hours 12 minutes 53 seconds  Total Procedure Duration: 0 hours 19 minutes 25 seconds  Findings:      Five sessile polyps were found in the transverse colon. The polyps were       2 to 3 mm in size. These polyps were removed with a cold snare.       Resection and retrieval were complete.      Three small patchy  angiodysplastic lesions without bleeding were found       in the ascending colon. Coagulation for tissue destruction using       monopolar probe was successful.      Scattered large-mouthed, medium-mouthed and small-mouthed diverticula       were found in the sigmoid colon and descending colon.      Three small AVMs were noted in the ascending colon and they were       ablated. No bleeding was induced. Impression:               - Five 2 to 3 mm polyps in the transverse colon,                            removed with a cold snare. Resected and retrieved.                           - Two non-bleeding colonic angiodysplastic lesions.                            Treated with a monopolar probe.                           - Diverticulosis in the sigmoid colon and in the  descending colon. Recommendation:           - Patient has a contact number available for                            emergencies. The signs and symptoms of potential                            delayed complications were discussed with the                            patient. Return to normal activities tomorrow.                            Written discharge instructions were provided to the                            patient.                           - Resume previous diet.                           - Repeat colonoscopy in 3 years for surveillance,                            if clinically appropriate.                           - Follow HGB. Procedure Code(s):        --- Professional ---                           [phone], Colonoscopy, flexible; with ablation of                            tumor(s), polyp(s), or other lesion(s) (includes                            pre- and post-dilation and guide wire passage, when                            performed)                           45385, 59, Colonoscopy, flexible; with removal of                            tumor(s), polyp(s), or other lesion(s) by snare                             technique Diagnosis Code(s):        --- Professional ---                           D12.3, Benign neoplasm of transverse colon (hepatic  flexure or splenic flexure)                           K55.20, Angiodysplasia of colon without hemorrhage                           K92.1, Melena (includes Hematochezia)                           D50.9, Iron deficiency anemia, unspecified                           K57.30, Diverticulosis of large intestine without                            perforation or abscess without bleeding CPT copyright 2022 American [employer] Association. All rights reserved. The codes documented in this report are preliminary and upon coder review may  be revised to meet current compliance requirements. Belvie Just, MD Belvie Just, MD 07/22/2024 11:06:33 AM This report has been signed electronically. Number of Addenda: 0

## 2024-07-22 NOTE — Progress Notes (Signed)
 Progress Note  Patient Name: Oscar Moses Date of Encounter: 07/22/2024 [facility] Cardiologist: None   Interval Summary    No chest pain or dyspnea this am.  Creatinine improved to 1.30.  Hemoglobin 9.6.  BP 179/88.  Remains on nitroglycerin drip  Vital Signs Vitals:   07/22/24 0852 07/22/24 1100 07/22/24 1115 07/22/24 1154  BP: (!) 160/72 133/74 (!) 153/65 (!) 179/88  Pulse: (!) 123 88 88 (!) 105  Resp: 15 19 (!) 23 20  Temp: (!) 97.4 F (36.3 C) 98.7 F (37.1 C) 98.7 F (37.1 C) 98.1 F (36.7 C)  TempSrc: Temporal   Oral  SpO2: 96% 95% 97% 98%  Weight:      Height:        Intake/Output Summary (Last 24 hours) at 07/22/2024 1215 Last data filed at 07/22/2024 1050 Gross per 24 hour  Intake 2665 ml  Output 730 ml  Net 1935 ml      07/22/2024    3:05 AM 07/21/2024    7:26 AM 07/21/2024    4:02 AM  Last 3 Weights  Weight (lbs) 151 lb 14.4 oz 157 lb 157 lb 11.2 oz  Weight (kg) 68.901 kg 71.215 kg 71.532 kg      aspirin EC  81 mg Oral Daily   hydrALAZINE  100 mg Oral Q8H   insulin aspart  0-15 Units Subcutaneous Q4H   insulin glargine-yfgn  12 Units Subcutaneous QHS   metoprolol succinate  50 mg Oral Daily   pantoprazole  40 mg Oral BID   polyethylene glycol  17 g Oral Daily   senna-docusate  2 tablet Oral QHS   simvastatin  20 mg Oral q morning   sodium chloride  flush  3 mL Intravenous Q12H    Telemetry/ECG   Sinus - Personally Reviewed  Physical Exam   General: Well developed, well nourished, NAD  SKIN: warm, dry. Neuro: No focal deficits  Psychiatric: Mood and affect normal  Neck: No JVD Lungs:Clear bilaterally, no wheezes, rhonci, crackles Cardiovascular: Regular rate and rhythm. No murmurs, gallops or rubs. Abdomen:Soft.  Extremities: No lower extremity edema.    Assessment & Plan    Chest pain: Reported chest pain on admission, with mildly elevated troponins but flat 31 > 26.  Reported 71-month history of exertional chest pain  concerning for angina.  Initially cardiac catheterization was planned on admission, however subsequently developed AKI and anemia and catheterization was put on hold.  Echocardiogram shows EF 55 to 60%, normal RV function, no significant valvular disease - Continue aspirin, statin - Likely plan outpatient ischemic evaluation once GIB issues resolved.  He is denying any chest pain but remains on nitroglycerin drip for his BP.  Need to wean him off nitroglycerin and see if chest pain recurs.   PAF: Sinus today.  -Continue metoprolol.  -No anticoagulation in setting of suspected GI bleed   Hypertensive urgency: BP is still elevated.  Remains on nitroglycerin drip.  He is on Toprol-XL 50 mg daily and hydralazine 100 mg 3 times daily.  Will add amlodipine 5 mg daily.  Discussed with nurse to wean nitroglycerin   Acute diastolic heart failure: Elevated BNP. Pleural effusion noted on chest CT. Worsened renal function with diuresis. Lasix on hold. LV and RV function normal on echo 07/19/24. He does not appear volume overloaded  AKI: Creatinine peaked 2.24 following diuresis.  Diuresis held, creatinine has improved, 1.3 today   Anemia: Hemoglobin dropped from 10.6 on admission to 7.7 on 11/12.  Reported dark stools.  GI workup in progress, no cause of anemia on EGD, colonoscopy today showed angioplastic lesions in ascending colon that were coagulated, multiple diverticula and 3 small AVMs  Signed,  Lonni LITTIE Nanas, MD  07/22/2024 12:15 PM

## 2024-07-22 NOTE — Transfer of Care (Signed)
 Immediate Anesthesia Transfer of Care Note  Patient: Oscar Moses  Procedure(s) Performed: COLONOSCOPY COLON, WITH ARGON PLASMA COAGULATION POLYPECTOMY, INTESTINE  Patient Location: PACU  Anesthesia Type:MAC  Level of Consciousness: awake  Airway & Oxygen Therapy: Patient Spontanous Breathing  Post-op Assessment: Report given to RN and Post -op Vital signs reviewed and stable  Post vital signs: Reviewed and stable  Last Vitals:  Vitals Value Taken Time  BP 133/74 07/22/24 11:00  Temp 37.1 C 07/22/24 11:00  Pulse 89 07/22/24 11:01  Resp 29 07/22/24 11:01  SpO2 96 % 07/22/24 11:01  Vitals shown include unfiled device data.  Last Pain:  Vitals:   07/22/24 0852  TempSrc: Temporal  PainSc: 0-No pain      Patients Stated Pain Goal: 0 (07/20/24 1941)  Complications: No notable events documented.

## 2024-07-22 NOTE — Progress Notes (Signed)
 PROGRESS NOTE    Oscar Moses  FMW:[id number] DOB: 20-Feb-1951 DOA: 07/17/2024 PCP: Onita Norleen, MD  Chief Complaint  Patient presents with   Shortness of Breath    Brief Narrative:   64 male HTN, HLD, CKD 2, GERD, DM2, Depression, celiac disease, COPD, anemia, low back pain admitted for shortness of breath.  Upon admission noted to have signs of volume overload with moderate bilateral pleural effusion/small pericardial effusion.  No PE seen on the CTA.  Cardiology was consulted and cautiously diuresing due to AKI. Due to persistent anemia, GI team consulted for their input as patient will need to be on anticoagulation.  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  Overall troponins are flat but there is concerns of ACS.  Plans for RHC/LHC but on hold due to AKI and anemia. - Echocardiogram with preserved ef, no RWMA. - lasix on hold - repeat CXR 11/16 - per cards, likely outpatient ischemic evaluation - wean nitroglycerin and watch for resumption of CP -A1c 9.6, LDL 31   Anemia Hb fluctuating - relatively stable Reported dark stools GI eval underway - s/p EGD with possible [place] segment of barrett's esophagus, duodenal mucosal changes concerning for celiac disease.  No cause ofr anemia, bleeding  noted.  Colonoscopy with 2 nonbleeding colonic angiodysplastic lesions, diverticulosis.  Five 2-3 mm polyps in transverse colon, removed with cold snare.  Needs repeat colonoscopy in 3 years. Anticoagulation currently on hold   AKI Creatinine peaked at 2.24 Creatinine 1.3 today Will continue to trend UA with 0-5 RBC, 300 mg/dl protein  Hypertension ?Hypertension emergency Initial systolic blood pressure greater than 200 therefore started on nitroglycerin drip.  Blood pressure has now stabilized.  Wean nitroglycerin as tolerated. Toprol-XL, Aldactone.   Atrial fibrillation, new onset Holding anticoagulation in setting of concern for GI bleeding metoprolol   Insulin-dependent diabetes mellitus type 2, uncontrolled due to hyperglycemia -basal and SSI - adjust prn    Hyperlipidemia -simvastatin   Prolonged QTc -Resolved  Celiac disease - Noted   COPD -As needed bronchodilators - no si/sx exacerbation      DVT prophylaxis: SCD Code Status: full Family Communication: none Disposition:   Status is: Inpatient Remains inpatient appropriate because: need for continued inpatient care   Consultants:  cardiology  Procedures:  EGD 11/14  Echo IMPRESSIONS     1. Left ventricular ejection fraction, by estimation, is 55 to 60%. The  left ventricle has normal function. The left ventricle has no regional  wall motion abnormalities. Left ventricular diastolic parameters are  consistent with Grade I diastolic  dysfunction (impaired relaxation).   2. Right ventricular systolic function is normal. The right ventricular  size is normal.   3. Left atrial size was moderately dilated.   4. Right atrial size was mildly dilated.   5. Oscar Moses small pericardial effusion  is present. There is no evidence of  cardiac tamponade.   6. The mitral valve is normal in structure. Mild mitral valve  regurgitation. No evidence of mitral stenosis.   7. The aortic valve is tricuspid. Aortic valve  regurgitation is not  visualized. No aortic stenosis is present.   8. The inferior vena cava is dilated in size with <50% respiratory  variability, suggesting right atrial pressure of 15 mmHg.   Antimicrobials:  Anti-infectives (From admission, onward)    None       Subjective: Feels better today  Objective: Vitals:   07/22/24 1100 07/22/24 1115 07/22/24 1154 07/22/24 1221  BP: 133/74 (!) 153/65 (!) 179/88 (!) 179/88  Pulse: 88 88 (!) 105 97  Resp: 19 (!) 23 20 20   Temp: 98.7 F (37.1 C) 98.7 F (37.1 C) 98.1 F (36.7 C) 98 F (36.7 C)  TempSrc:   Oral Oral  SpO2: 95% 97% 98% 97%  Weight:      Height:        Intake/Output Summary (Last 24 hours) at 07/22/2024 1441 Last data filed at 07/22/2024 1050 Gross per 24 hour  Intake 2665 ml  Output 730 ml  Net 1935 ml   Filed Weights   07/21/24 0402 07/21/24 0726 07/22/24 0305  Weight: 71.5 kg 71.2 kg 68.9 kg    Examination:  General: No acute distress. Cardiovascular: RRR Lungs: unlabored Neurological: Alert and oriented 3. Moves all extremities 4 with equal strength. Cranial nerves II through XII grossly intact. Extremities: No clubbing or cyanosis. No edema.    Data Reviewed: I have personally reviewed following labs and imaging studies  CBC: Recent Labs  Lab 07/18/24 1406 07/19/24 0420 07/19/24 1746 07/20/24 0351 07/20/24 1547 07/21/24 0308 07/21/24 1608 07/21/24 1818 07/22/24 0401  WBC 6.7 5.8  --  5.7  --  5.0  --   --  10.0  HGB 8.9* 8.0*   < > 7.8* 8.7* 7.9* 10.0* 9.3* 9.6*  HCT 26.5* 23.7*   < > 22.7* 26.0* 23.7* 29.8* 27.5* 28.8*  MCV 94.6 96.3  --  96.2  --  96.7  --   --  96.0  PLT 295 238  --  225  --  243  --   --  305   < > = values in this interval not displayed.    Basic Metabolic Panel: Recent Labs  Lab 07/18/24 1406 07/19/24 0420 07/20/24 0351 07/21/24 0308 07/22/24 0401  NA 137 134* 137 139 145  K 3.8 4.6 3.7 4.6 4.0  CL 103 98 103 105 104  CO2 24 24 25 25 25    GLUCOSE 151* 432* 91 104* 99  BUN 16 23 26* 18 10  CREATININE 1.49* 2.24* 1.97* 1.53* 1.30*  CALCIUM 8.2* 7.7* 7.9* 8.2* 8.9  MG 1.9  --  2.2  --   --   PHOS  --   --  4.9*  --   --     GFR: Estimated Creatinine Clearance: 49.3 mL/min (Oscar Moses) (by C-G formula based on SCr of 1.3 mg/dL (H)).  Liver Function Tests: Recent Labs  Lab 07/17/24 1443 07/18/24 1406 07/19/24 0420  AST 46* 27 21  ALT 33 25 21  ALKPHOS 102 60 60  BILITOT 0.5 0.5 0.7  PROT 6.9 5.9* 5.3*  ALBUMIN 3.6 2.8* 2.4*    CBG: Recent Labs  Lab 07/21/24 1638 07/21/24 1928 07/22/24 0056 07/22/24 0331 07/22/24 0748  GLUCAP 95 206* 244* 115* 120*     No results  found for this or any previous visit (from the past 240 hours).       Radiology Studies: No results found.      Scheduled Meds:  amLODipine  5 mg Oral Daily   aspirin EC  81 mg Oral Daily   hydrALAZINE  100 mg Oral Q8H   insulin aspart  0-15 Units Subcutaneous Q4H   insulin glargine-yfgn  12 Units Subcutaneous QHS   metoprolol succinate  50 mg Oral Daily   pantoprazole  40 mg Oral BID   polyethylene glycol  17 g Oral Daily   senna-docusate  2 tablet Oral QHS   simvastatin  20 mg Oral q morning   sodium chloride  flush  3 mL Intravenous Q12H   Continuous Infusions:  nitroGLYCERIN 25 mcg/min (07/22/24 1137)     LOS: 4 days    Time spent: over 30 min     Oscar Monte, MD [facility]   To contact the attending provider between 7A-7P or the covering provider during after hours 7P-7A, please log into the web site [email or web address] and access using universal [place] password for that web site. If you do not have the password, please call the hospital operator.  07/22/2024, 2:41 PM

## 2024-07-22 NOTE — Plan of Care (Signed)

## 2024-07-22 NOTE — Plan of Care (Signed)
  Problem: Education: Goal: Ability to describe self-care measures that may prevent or decrease complications (Diabetes Survival Skills Education) will improve Outcome: Progressing   Problem: Coping: Goal: Ability to adjust to condition or change in health will improve Outcome: Progressing   Problem: Metabolic: Goal: Ability to maintain appropriate glucose levels will improve Outcome: Progressing   Problem: Nutritional: Goal: Maintenance of adequate nutrition will improve Outcome: Progressing   Problem: Activity: Goal: Ability to return to baseline activity level will improve Outcome: Progressing   Problem: Education: Goal: Ability to verbalize understanding of medication therapies will improve Outcome: Progressing

## 2024-07-22 NOTE — Interval H&P Note (Signed)
 History and Physical Interval Note:  07/22/2024 9:56 AM  Oscar Moses  has presented today for surgery, with the diagnosis of anemia, dark stools - negative EGD.  The various methods of treatment have been discussed with the patient and family. After consideration of risks, benefits and other options for treatment, the patient has consented to  Procedure(s): COLONOSCOPY (N/A) as a surgical intervention.  The patient's history has been reviewed, patient examined, no change in status, stable for surgery.  I have reviewed the patient's chart and labs.  Questions were answered to the patient's satisfaction.     [name]

## 2024-07-22 NOTE — Anesthesia Preprocedure Evaluation (Signed)
 Anesthesia Evaluation  Patient identified by MRN, date of birth, ID band Patient awake    Reviewed: Allergy & Precautions, H&P , NPO status , Patient's Chart, lab work & pertinent test results  Airway Mallampati: II   Neck ROM: full    Dental   Pulmonary COPD, former smoker   breath sounds clear to auscultation       Cardiovascular hypertension, + angina  + Past MI   Rhythm:regular Rate:Normal     Neuro/Psych  PSYCHIATRIC DISORDERS  Depression       GI/Hepatic ,GERD  ,,  Endo/Other  diabetes, Type 2    Renal/GU      Musculoskeletal   Abdominal   Peds  Hematology  (+) Blood dyscrasia, anemia Hemoglobin 9.6   Anesthesia Other Findings   Reproductive/Obstetrics                              Anesthesia Physical Anesthesia Plan  ASA: 3  Anesthesia Plan: MAC   Post-op Pain Management:    Induction: Intravenous  PONV Risk Score and Plan: 1 and Propofol infusion and Treatment may vary due to age or medical condition  Airway Management Planned: Simple Face Mask  Additional Equipment:   Intra-op Plan:   Post-operative Plan:   Informed Consent: I have reviewed the patients History and Physical, chart, labs and discussed the procedure including the risks, benefits and alternatives for the proposed anesthesia with the patient or authorized representative who has indicated his/her understanding and acceptance.     Dental advisory given  Plan Discussed with: CRNA, Anesthesiologist and Surgeon  Anesthesia Plan Comments:         Anesthesia Quick Evaluation

## 2024-07-23 ENCOUNTER — Inpatient Hospital Stay (HOSPITAL_COMMUNITY)

## 2024-07-23 ENCOUNTER — Encounter (HOSPITAL_COMMUNITY): Payer: Self-pay | Admitting: Gastroenterology

## 2024-07-23 DIAGNOSIS — D649 Anemia, unspecified: Secondary | ICD-10-CM | POA: Diagnosis not present

## 2024-07-23 DIAGNOSIS — N179 Acute kidney failure, unspecified: Secondary | ICD-10-CM | POA: Diagnosis not present

## 2024-07-23 DIAGNOSIS — I161 Hypertensive emergency: Secondary | ICD-10-CM | POA: Diagnosis not present

## 2024-07-23 DIAGNOSIS — I214 Non-ST elevation (NSTEMI) myocardial infarction: Secondary | ICD-10-CM | POA: Diagnosis not present

## 2024-07-23 DIAGNOSIS — R079 Chest pain, unspecified: Secondary | ICD-10-CM | POA: Diagnosis not present

## 2024-07-23 LAB — BASIC METABOLIC PANEL WITH GFR
Anion gap: 10 (ref 5–15)
BUN: 14 mg/dL (ref 8–23)
CO2: 24 mmol/L (ref 22–32)
Calcium: 8.1 mg/dL — ABNORMAL LOW (ref 8.9–10.3)
Chloride: 104 mmol/L (ref 98–111)
Creatinine, Ser: 1.37 mg/dL — ABNORMAL HIGH (ref 0.61–1.24)
GFR, Estimated: 54 mL/min — ABNORMAL LOW (ref >60)
Glucose, Bld: 145 mg/dL — ABNORMAL HIGH (ref 70–99)
Potassium: 3.6 mmol/L (ref 3.5–5.1)
Sodium: 138 mmol/L (ref 135–145)

## 2024-07-23 LAB — GLUCOSE, CAPILLARY
Glucose-Capillary: 136 mg/dL — ABNORMAL HIGH (ref 70–99)
Glucose-Capillary: 137 mg/dL — ABNORMAL HIGH (ref 70–99)
Glucose-Capillary: 140 mg/dL — ABNORMAL HIGH (ref 70–99)
Glucose-Capillary: 182 mg/dL — ABNORMAL HIGH (ref 70–99)
Glucose-Capillary: 253 mg/dL — ABNORMAL HIGH (ref 70–99)
Glucose-Capillary: 292 mg/dL — ABNORMAL HIGH (ref 70–99)
Glucose-Capillary: 96 mg/dL (ref 70–99)

## 2024-07-23 LAB — CBC
HCT: 28.7 % — ABNORMAL LOW (ref 39.0–52.0)
Hemoglobin: 9.6 g/dL — ABNORMAL LOW (ref 13.0–17.0)
MCH: 32.4 pg (ref 26.0–34.0)
MCHC: 33.4 g/dL (ref 30.0–36.0)
MCV: 97 fL (ref 80.0–100.0)
Platelets: 266 K/uL (ref 150–400)
RBC: 2.96 MIL/uL — ABNORMAL LOW (ref 4.22–5.81)
RDW: 13 % (ref 11.5–15.5)
WBC: 8.6 K/uL (ref 4.0–10.5)
nRBC: 0 % (ref 0.0–0.2)

## 2024-07-23 LAB — BRAIN NATRIURETIC PEPTIDE: B Natriuretic Peptide: 342.9 pg/mL — ABNORMAL HIGH (ref 0.0–100.0)

## 2024-07-23 NOTE — Progress Notes (Signed)
 PROGRESS NOTE    Oscar Moses  FMW:[id number] DOB: 12-28-1950 DOA: 07/17/2024 PCP: Onita Norleen, MD  Chief Complaint  Patient presents with   Shortness of Breath    Brief Narrative:   89 male HTN, HLD, CKD 2, GERD, DM2, Depression, celiac disease, COPD, anemia, low back pain admitted for shortness of breath.  Upon admission noted to have signs of volume overload with moderate bilateral pleural effusion/small pericardial effusion.  No PE seen on the CTA.  Cardiology was consulted and cautiously diuresing due to AKI. Due to persistent anemia, GI team consulted for their input as patient will need to be on anticoagulation.  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  Overall troponins are flat but there is concerns of ACS.  Plans for RHC/LHC but on hold due to AKI and anemia. - Echocardiogram with preserved ef, no RWMA. - lasix on hold - CXR with slightly increased bilateral effusions - follow BNP - per cards, likely outpatient ischemic evaluation -A1c 9.6, LDL 31   Anemia Hb fluctuating - relatively stable Reported dark stools GI eval underway - s/p EGD with possible [place] segment of barrett's esophagus, duodenal mucosal changes concerning for celiac disease.  No cause ofr anemia, bleeding noted.   Colonoscopy with 2 nonbleeding colonic angiodysplastic lesions, diverticulosis.  Five 2-3 mm polyps in transverse colon, removed with cold snare.  Needs repeat colonoscopy in 3 years. Anticoagulation currently on hold Needs GI follow up in 1-2 weeks with [facility] - AVM's suspected possible source of melena   AKI Creatinine peaked at 2.24 Creatinine 1.3 today Will continue to trend UA with 0-5 RBC, 300 mg/dl protein  Hypertension ?Hypertension emergency Initial systolic blood pressure greater than 200 therefore started on nitroglycerin drip.  Blood pressure has now stabilized. Toprol-XL, Aldactone, hydralazine.   Atrial fibrillation, new onset Holding anticoagulation in setting of concern for GI bleeding metoprolol   Insulin-dependent diabetes mellitus type 2, uncontrolled due to hyperglycemia -basal and SSI - adjust prn    Hyperlipidemia -simvastatin   Prolonged QTc -Resolved  Celiac disease - Noted   COPD -As needed bronchodilators - no si/sx exacerbation      DVT prophylaxis: SCD Code Status: full Family Communication: none Disposition:   Status is: Inpatient Remains inpatient appropriate because: need for continued inpatient care   Consultants:  cardiology  Procedures:  EGD 11/14  Echo IMPRESSIONS     1. Left ventricular ejection fraction, by estimation, is 55 to 60%. The  left ventricle has normal function. The left ventricle has no regional  wall motion abnormalities. Left ventricular diastolic parameters are  consistent with Grade I diastolic  dysfunction (impaired relaxation).   2. Right ventricular systolic function is normal. The right ventricular  size is normal.   3. Left atrial size was moderately dilated.   4. Right atrial size  was mildly dilated.   5. Jamice Carreno small pericardial effusion is present. There is no evidence of  cardiac tamponade.   6. The mitral valve is normal in structure. Mild mitral valve  regurgitation. No evidence of  mitral stenosis.   7. The aortic valve is tricuspid. Aortic valve regurgitation is not  visualized. No aortic stenosis is present.   8. The inferior vena cava is dilated in size with <50% respiratory  variability, suggesting right atrial pressure of 15 mmHg.   Antimicrobials:  Anti-infectives (From admission, onward)    None       Subjective: No new complaints  Objective: Vitals:   07/23/24 0811 07/23/24 0915 07/23/24 1113 07/23/24 1528  BP:  (!) 148/65    Pulse:  88    Resp: 18  18 18   Temp: 98 F (36.7 C)  98 F (36.7 C) 97.8 F (36.6 C)  TempSrc: Oral  Oral Oral  SpO2:  97%    Weight:      Height:        Intake/Output Summary (Last 24 hours) at 07/23/2024 1552 Last data filed at 07/23/2024 1300 Gross per 24 hour  Intake 750 ml  Output 950 ml  Net -200 ml   Filed Weights   07/21/24 0726 07/22/24 0305 07/23/24 0424  Weight: 71.2 kg 68.9 kg 70.7 kg    Examination:  General: No acute distress. Cardiovascular: RRR Lungs: unlabored Neurological: Alert and oriented 3. Moves all extremities 4 with equal strength. Cranial nerves II through XII grossly intact. Skin: Warm and dry. No rashes or lesions. Extremities: no LEE   Data Reviewed: I have personally reviewed following labs and imaging studies  CBC: Recent Labs  Lab 07/19/24 0420 07/19/24 1746 07/20/24 0351 07/20/24 1547 07/21/24 0308 07/21/24 1608 07/21/24 1818 07/22/24 0401 07/22/24 1541 07/23/24 0503  WBC 5.8  --  5.7  --  5.0  --   --  10.0  --  8.6  HGB 8.0*   < > 7.8*   < > 7.9* 10.0* 9.3* 9.6* 9.6* 9.6*  HCT 23.7*   < > 22.7*   < > 23.7* 29.8* 27.5* 28.8* 28.8* 28.7*  MCV 96.3  --  96.2  --  96.7  --   --  96.0  --  97.0  PLT 238  --  225  --  243  --   --  305  --  266   < > = values in this interval not displayed.    Basic Metabolic Panel: Recent Labs  Lab 07/18/24 1406 07/19/24 0420 07/20/24 0351 07/21/24 0308 07/22/24 0401 07/23/24 0503  NA 137 134* 137 139 145 138  K  3.8 4.6 3.7 4.6 4.0 3.6  CL 103 98 103 105 104 104  CO2 24 24 25 25 25 24   GLUCOSE 151* 432* 91 104* 99 145*  BUN 16 23 26* 18 10 14   CREATININE 1.49* 2.24* 1.97* 1.53* 1.30* 1.37*  CALCIUM 8.2* 7.7* 7.9* 8.2* 8.9 8.1*  MG 1.9  --  2.2  --   --   --   PHOS  --   --  4.9*  --   --   --     GFR: Estimated Creatinine Clearance: 48 mL/min (Claudia Greenley) (by C-G formula based on SCr of 1.37 mg/dL (H)).  Liver Function Tests: Recent Labs  Lab 07/17/24 1443 07/18/24 1406 07/19/24 0420  AST 46* 27 21  ALT 33 25 21  ALKPHOS 102 60 60  BILITOT 0.5 0.5 0.7  PROT 6.9 5.9* 5.3*  ALBUMIN 3.6 2.8* 2.4*    CBG: Recent Labs  Lab 07/23/24 0015 07/23/24 0427 07/23/24 0811 07/23/24 1113 07/23/24 1528  GLUCAP 96 137* 140* 292* 182*     No results found for this or any previous visit (from the past 240 hours).       Radiology Studies: DG Chest 2 View Result Date: 07/23/2024 CLINICAL DATA:  Shortness of breath.  Pleural effusions. EXAM: DG CHEST 2V COMPARISON:  Radiographs and CT 07/17/2024. FINDINGS: The heart size and mediastinal contours are stable. Persistent bilateral pleural effusions appear slightly larger based on the lateral view. The overall pulmonary aeration has improved, and there is no edema, confluent airspace disease or pneumothorax. Postsurgical changes from previous lower cervical fusion and mild thoracic spine degenerative changes are noted. IMPRESSION: Slightly increased bilateral pleural effusions. No evidence of pneumonia or edema. Electronically Signed   By: Elsie Perone M.D.   On: 07/23/2024 11:27        Scheduled Meds:  amLODipine  5 mg Oral Daily   aspirin EC  81 mg Oral Daily   hydrALAZINE  100 mg Oral Q8H   insulin aspart  0-15 Units Subcutaneous Q4H   insulin glargine-yfgn  12 Units Subcutaneous QHS   metoprolol succinate  50 mg Oral Daily   pantoprazole  40 mg Oral BID   polyethylene glycol  17 g Oral Daily   senna-docusate  2 tablet Oral QHS    simvastatin  20 mg Oral q morning   sodium chloride  flush  3 mL Intravenous Q12H   Continuous Infusions:  nitroGLYCERIN Stopped (07/22/24 2230)     LOS: 5 days    Time spent: over 30 min     Meliton Monte, MD [facility]   To contact the attending provider between 7A-7P or the covering provider during after hours 7P-7A, please log into the web site [email or web address] and access using universal [place] password for that web site. If you do not have the password, please call the hospital operator.  07/23/2024, 3:52 PM

## 2024-07-23 NOTE — Plan of Care (Signed)

## 2024-07-23 NOTE — Plan of Care (Signed)
   Problem: Coping: Goal: Ability to adjust to condition or change in health will improve Outcome: Progressing   Problem: Fluid Volume: Goal: Ability to maintain a balanced intake and output will improve Outcome: Progressing   Problem: Metabolic: Goal: Ability to maintain appropriate glucose levels will improve Outcome: Progressing

## 2024-07-23 NOTE — Progress Notes (Signed)
 Subjective: No complaints.  Feeling well.  Objective: Vital signs in last 24 hours: Temp:  [97.4 F (36.3 C)-98.7 F (37.1 C)] 98 F (36.7 C) (11/16 0424) Pulse Rate:  [72-123] 72 (11/16 0424) Resp:  [12-23] 18 (11/16 0424) BP: (121-179)/(50-88) 155/72 (11/16 0424) SpO2:  [94 %-98 %] 97 % (11/16 0424) Weight:  [70.7 kg] 70.7 kg (11/16 0424) Last BM Date : 07/14/24  Intake/Output from previous day: 11/15 0701 - 11/16 0700 In: 540 [P.O.:240; I.V.:300] Out: 650 [Urine:650] Intake/Output this shift: No intake/output data recorded.  General appearance: alert and no distress GI: soft, non-tender; bowel sounds normal; no masses,  no organomegaly  Lab Results: Recent Labs    07/21/24 0308 07/21/24 1608 07/22/24 0401 07/22/24 1541 07/23/24 0503  WBC 5.0  --  10.0  --  8.6  HGB 7.9*   < > 9.6* 9.6* 9.6*  HCT 23.7*   < > 28.8* 28.8* 28.7*  PLT 243  --  305  --  266   < > = values in this interval not displayed.   BMET Recent Labs    07/21/24 0308 07/22/24 0401 07/23/24 0503  NA 139 145 138  K 4.6 4.0 3.6  CL 105 104 104  CO2 25 25 24   GLUCOSE 104* 99 145*  BUN 18 10 14   CREATININE 1.53* 1.30* 1.37*  CALCIUM 8.2* 8.9 8.1*   LFT No results for input(s): PROT, ALBUMIN, AST, ALT, ALKPHOS, BILITOT, BILIDIR, IBILI in the last 72 hours. PT/INR No results for input(s): LABPROT, INR in the last 72 hours. Hepatitis Panel No results for input(s): HEPBSAG, HCVAB, HEPAIGM, HEPBIGM in the last 72 hours. C-Diff No results for input(s): CDIFFTOX in the last 72 hours. Fecal Lactopherrin No results for input(s): FECLLACTOFRN in the last 72 hours.  Studies/Results: No results found.  Medications: Scheduled:  amLODipine  5 mg Oral Daily   aspirin EC  81 mg Oral Daily   hydrALAZINE  100 mg Oral Q8H   insulin aspart  0-15 Units Subcutaneous Q4H   insulin glargine-yfgn  12 Units Subcutaneous QHS   metoprolol succinate  50 mg Oral Daily    pantoprazole  40 mg Oral BID   polyethylene glycol  17 g Oral Daily   senna-docusate  2 tablet Oral QHS   simvastatin  20 mg Oral q morning   sodium chloride  flush  3 mL Intravenous Q12H   Continuous:  nitroGLYCERIN Stopped (07/22/24 2230)    Assessment/Plan: 1) Ascending colon AVMs s/p APC. 2) Colonic polyps. 3) Anemia - stable. 4) Chest pain - resolved.   The patient's HGB is stable.  Three small nonbleeding AVMs were noted with the colonoscopy yesterday.  These were possibly the source of the melena.  He is stable from the GI standpoint.  Plan: 1) Continue to monitor HGB. 2) Further cardiac management per Cardiology. 3) Signing off. 4) Please arrange for follow up with [facility] in 1-2 weeks.  LOS: 5 days   Syra Sirmons D 07/23/2024, 7:48 AM

## 2024-07-23 NOTE — Progress Notes (Signed)
 Progress Note  Patient Name: Oscar Moses Date of Encounter: 07/23/2024 [facility] Cardiologist: None   Interval Summary    BP 155/72.  Creatinine stable at 1.37.  Hemoglobin stable at 9.6.  Denies any chest pain  Vital Signs Vitals:   07/23/24 0230 07/23/24 0330 07/23/24 0424 07/23/24 0811  BP: (!) 139/55 125/60 (!) 155/72   Pulse:  73 72   Resp:   18 18  Temp:   98 F (36.7 C) 98 F (36.7 C)  TempSrc:   Oral Oral  SpO2:  94% 97%   Weight:   70.7 kg   Height:        Intake/Output Summary (Last 24 hours) at 07/23/2024 0824 Last data filed at 07/23/2024 9187 Gross per 24 hour  Intake 540 ml  Output 950 ml  Net -410 ml      07/23/2024    4:24 AM 07/22/2024    3:05 AM 07/21/2024    7:26 AM  Last 3 Weights  Weight (lbs) 155 lb 13.8 oz 151 lb 14.4 oz 157 lb  Weight (kg) 70.7 kg 68.901 kg 71.215 kg      amLODipine  5 mg Oral Daily   aspirin EC  81 mg Oral Daily   hydrALAZINE  100 mg Oral Q8H   insulin aspart  0-15 Units Subcutaneous Q4H   insulin glargine-yfgn  12 Units Subcutaneous QHS   metoprolol succinate  50 mg Oral Daily   pantoprazole  40 mg Oral BID   polyethylene glycol  17 g Oral Daily   senna-docusate  2 tablet Oral QHS   simvastatin  20 mg Oral q morning   sodium chloride  flush  3 mL Intravenous Q12H    Telemetry/ECG   Sinus - Personally Reviewed  Physical Exam   General: Well developed, well nourished, NAD  SKIN: warm, dry. Neuro: No focal deficits  Psychiatric: Mood and affect normal  Neck: No JVD Lungs:Clear bilaterally, no wheezes, rhonci, crackles Cardiovascular: Regular rate and rhythm. No murmurs, gallops or rubs. Abdomen:Soft.  Extremities: No lower extremity edema.    Assessment & Plan    Chest pain: Reported chest pain on admission, with mildly elevated troponins but flat 31 > 26.  Reported 64-month history of exertional chest pain concerning for angina.  Initially cardiac catheterization was planned on admission,  however subsequently developed AKI and anemia and catheterization was put on hold.  Echocardiogram shows EF 55 to 60%, normal RV function, no significant valvular disease - Continue aspirin, statin - Likely plan outpatient ischemic evaluation once ensure GI bleeding issues resolved.  He had been on nitroglycerin drip for hypertensive urgency, this was weaned off yesterday.  Recommend monitoring today for further chest pain since off nitroglycerin.  If no further chest pain, would plan outpatient ischemic evaluation   PAF: Sinus today.  -Continue metoprolol.  - Anticoagulation on hold in setting of suspected GI bleed   Hypertensive urgency:  He is on Toprol-XL 50 mg daily and hydralazine 100 mg 3 times daily.  Added amlodipine 5 mg daily yesterday, weaned off nitroglycerin drip   Acute diastolic heart failure: Elevated BNP. Pleural effusion noted on chest CT. Worsened renal function with diuresis. Lasix on hold. LV and RV function normal on echo 07/19/24. He does not appear volume overloaded  AKI: Creatinine peaked 2.24 following diuresis.  Diuresis held, creatinine has improved, 1.37 today   Anemia: Hemoglobin dropped from 10.6 on admission to 7.7 on 11/12.  Reported dark stools.  GI workup in  progress, no cause of anemia on EGD, colonoscopy today showed angioplastic lesions in ascending colon that were coagulated, multiple diverticula and 3 small AVMs.  Hemoglobin currently stable at 9.6  Signed,  Lonni LITTIE Nanas, MD  07/23/2024 8:24 AM

## 2024-07-24 DIAGNOSIS — K5730 Diverticulosis of large intestine without perforation or abscess without bleeding: Secondary | ICD-10-CM

## 2024-07-24 DIAGNOSIS — I480 Paroxysmal atrial fibrillation: Secondary | ICD-10-CM | POA: Diagnosis not present

## 2024-07-24 DIAGNOSIS — K5520 Angiodysplasia of colon without hemorrhage: Secondary | ICD-10-CM

## 2024-07-24 DIAGNOSIS — I214 Non-ST elevation (NSTEMI) myocardial infarction: Secondary | ICD-10-CM | POA: Diagnosis not present

## 2024-07-24 DIAGNOSIS — K922 Gastrointestinal hemorrhage, unspecified: Secondary | ICD-10-CM

## 2024-07-24 DIAGNOSIS — D62 Acute posthemorrhagic anemia: Secondary | ICD-10-CM

## 2024-07-24 DIAGNOSIS — N179 Acute kidney failure, unspecified: Secondary | ICD-10-CM | POA: Diagnosis not present

## 2024-07-24 LAB — CBC
HCT: 32.2 % — ABNORMAL LOW (ref 39.0–52.0)
Hemoglobin: 10.7 g/dL — ABNORMAL LOW (ref 13.0–17.0)
MCH: 32.5 pg (ref 26.0–34.0)
MCHC: 33.2 g/dL (ref 30.0–36.0)
MCV: 97.9 fL (ref 80.0–100.0)
Platelets: 349 K/uL (ref 150–400)
RBC: 3.29 MIL/uL — ABNORMAL LOW (ref 4.22–5.81)
RDW: 12.7 % (ref 11.5–15.5)
WBC: 8 K/uL (ref 4.0–10.5)
nRBC: 0 % (ref 0.0–0.2)

## 2024-07-24 LAB — BASIC METABOLIC PANEL WITH GFR
Anion gap: 10 (ref 5–15)
BUN: 14 mg/dL (ref 8–23)
CO2: 23 mmol/L (ref 22–32)
Calcium: 8.5 mg/dL — ABNORMAL LOW (ref 8.9–10.3)
Chloride: 103 mmol/L (ref 98–111)
Creatinine, Ser: 1.16 mg/dL (ref 0.61–1.24)
GFR, Estimated: >60 mL/min (ref >60)
Glucose, Bld: 134 mg/dL — ABNORMAL HIGH (ref 70–99)
Potassium: 4.2 mmol/L (ref 3.5–5.1)
Sodium: 136 mmol/L (ref 135–145)

## 2024-07-24 LAB — GLUCOSE, CAPILLARY
Glucose-Capillary: 101 mg/dL — ABNORMAL HIGH (ref 70–99)
Glucose-Capillary: 146 mg/dL — ABNORMAL HIGH (ref 70–99)
Glucose-Capillary: 156 mg/dL — ABNORMAL HIGH (ref 70–99)
Glucose-Capillary: 162 mg/dL — ABNORMAL HIGH (ref 70–99)
Glucose-Capillary: 187 mg/dL — ABNORMAL HIGH (ref 70–99)
Glucose-Capillary: 248 mg/dL — ABNORMAL HIGH (ref 70–99)
Glucose-Capillary: 304 mg/dL — ABNORMAL HIGH (ref 70–99)

## 2024-07-24 MED ORDER — INSULIN ASPART 100 UNIT/ML IJ SOLN
4.0000 [IU] | Freq: Three times a day (TID) | INTRAMUSCULAR | Status: DC
  Administered 2024-07-25: 4 [IU] via SUBCUTANEOUS
  Filled 2024-07-24: qty 4

## 2024-07-24 MED ORDER — LIDOCAINE 5 % EX PTCH
3.0000 | MEDICATED_PATCH | CUTANEOUS | Status: DC
  Administered 2024-07-24: 3 via TRANSDERMAL
  Filled 2024-07-24 (×2): qty 3

## 2024-07-24 MED ORDER — HYDROXYZINE HCL 10 MG PO TABS
10.0000 mg | ORAL_TABLET | Freq: Three times a day (TID) | ORAL | Status: DC | PRN
  Administered 2024-07-24: 10 mg via ORAL
  Filled 2024-07-24: qty 1

## 2024-07-24 MED ORDER — ASPIRIN 81 MG PO CHEW
81.0000 mg | CHEWABLE_TABLET | ORAL | Status: AC
  Administered 2024-07-25: 81 mg via ORAL
  Filled 2024-07-24: qty 1

## 2024-07-24 MED ORDER — AMIODARONE HCL IN DEXTROSE 360-4.14 MG/200ML-% IV SOLN: 60.0000 mg/h | INTRAVENOUS | Status: DC

## 2024-07-24 MED ORDER — POTASSIUM CHLORIDE CRYS ER 20 MEQ PO TBCR
20.0000 meq | EXTENDED_RELEASE_TABLET | Freq: Once | ORAL | Status: AC
  Administered 2024-07-24: 20 meq via ORAL
  Filled 2024-07-24: qty 1

## 2024-07-24 MED ORDER — ACETAMINOPHEN 325 MG PO TABS: 650.0000 mg | ORAL_TABLET | Freq: Four times a day (QID) | ORAL | Status: DC | PRN

## 2024-07-24 MED ORDER — AMLODIPINE BESYLATE 10 MG PO TABS
10.0000 mg | ORAL_TABLET | Freq: Every day | ORAL | Status: DC
  Administered 2024-07-24 – 2024-07-25 (×2): 10 mg via ORAL
  Filled 2024-07-24 (×2): qty 1

## 2024-07-24 MED ORDER — FREE WATER
500.0000 mL | Freq: Once | Status: AC
  Administered 2024-07-25: 500 mL via ORAL

## 2024-07-24 MED ORDER — AMIODARONE HCL IN DEXTROSE 360-4.14 MG/200ML-% IV SOLN: 30.0000 mg/h | INTRAVENOUS | Status: DC

## 2024-07-24 MED ORDER — AMIODARONE LOAD VIA INFUSION
150.0000 mg | Freq: Once | INTRAVENOUS | Status: DC
  Filled 2024-07-24: qty 83.34

## 2024-07-24 MED ORDER — ACETAMINOPHEN 500 MG PO TABS
1000.0000 mg | ORAL_TABLET | Freq: Three times a day (TID) | ORAL | Status: DC
  Administered 2024-07-24 – 2024-07-25 (×4): 1000 mg via ORAL
  Filled 2024-07-24 (×4): qty 2

## 2024-07-24 MED ORDER — INSULIN GLARGINE-YFGN 100 UNIT/ML ~~LOC~~ SOLN
14.0000 [IU] | Freq: Every day | SUBCUTANEOUS | Status: DC
  Administered 2024-07-24: 14 [IU] via SUBCUTANEOUS
  Filled 2024-07-24 (×2): qty 0.14

## 2024-07-24 MED ORDER — ORAL CARE MOUTH RINSE: 15.0000 mL | OROMUCOSAL | Status: DC | PRN

## 2024-07-24 MED ORDER — AMIODARONE HCL 200 MG PO TABS
200.0000 mg | ORAL_TABLET | Freq: Two times a day (BID) | ORAL | Status: DC
  Administered 2024-07-24 – 2024-07-25 (×3): 200 mg via ORAL
  Filled 2024-07-24 (×3): qty 1

## 2024-07-24 MED ORDER — FUROSEMIDE 10 MG/ML IJ SOLN
20.0000 mg | Freq: Once | INTRAMUSCULAR | Status: AC
  Administered 2024-07-24: 20 mg via INTRAVENOUS
  Filled 2024-07-24: qty 2

## 2024-07-24 MED ORDER — OXYCODONE HCL 5 MG PO TABS
5.0000 mg | ORAL_TABLET | ORAL | Status: DC | PRN
  Administered 2024-07-24 – 2024-07-25 (×5): 5 mg via ORAL
  Filled 2024-07-24 (×5): qty 1

## 2024-07-24 NOTE — Plan of Care (Signed)

## 2024-07-24 NOTE — Evaluation (Signed)
 Physical Therapy Evaluation Patient Details Name: Oscar Moses MRN: [id number] DOB: Mar 04, 1951 Today's Date: 07/24/2024  History of Present Illness  The pt is a 73 yo male presenting 11/10 with SOB and chest pressure. Work up revealed HTN crisis, BNP >2800, moderate bilateral pleural effusion, and brief run of afib. Admitted for management of unstable angina with acute CHF. S/p EGD 11/14, colonoscopy 11/15 showed angioplastic lesions in ascending colon that were coagulated, multiple diverticula and 3 small AVMs. 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   Clinical Impression  Pt in bed upon arrival of PT, agreeable to evaluation at this time. Prior to admission the pt was completely independent, living with his wife in a home with 2 stairs to enter, walking 2.5 miles/day without need for DME or recent falls. Pt reports a few weeks of gradual decline and weakness prior to admission. Pt able to complete all mobility without need for physical assistance, required slightly increased time initially with gait, but progressed to self-reported normal walking pace. Pt also completed 3 stairs mimicking home environment with HR to 100bpm and no need for physical assist. Demos good stability with balance testing, but mild increase in sway without LOB. However, after pt returned to room and was standing talking with PT, HR spiked to 120s with afib rhythm, reaching max of 147bpm. Recovered to 110-120bpm with seated rest but remained in afib. MD and RN aware. Will continue to follow during admission, do not anticipate need for follow up skilled PT after d/c.        If plan is discharge home, recommend the following: Assistance with cooking/housework;Assist for transportation;Help with stairs or ramp for entrance   Can travel by private vehicle        Equipment Recommendations None recommended by PT   Recommendations for Other Services       Functional Status Assessment Patient has had a recent decline in their functional status and demonstrates the ability to make significant improvements in function in a reasonable and predictable amount of time.     Precautions / Restrictions Precautions Precautions: Fall Restrictions Weight Bearing Restrictions Per Provider Order: No      Mobility  Bed Mobility Overal bed mobility: Independent             General bed mobility comments: no assist, slightly increased time    Transfers Overall transfer level: Needs assistance Equipment used: None Transfers: Sit to/from Stand Sit to Stand: Supervision           General transfer comment: supervision in session, no UE support or increased time.    Ambulation/Gait Ambulation/Gait assistance: Supervision Gait Distance (Feet): 250 Feet Assistive device: None Gait Pattern/deviations: Step-through pattern Gait velocity: 0.83 m/s Gait velocity interpretation: >2.62 ft/sec, indicative of community ambulatory   General Gait Details: pt initially with slow, guarded gait but improved with continued ambulation, HR 100bpm during gait  Stairs Stairs: Yes Stairs assistance: Supervision Stair Management: One rail Right, Alternating pattern, Step to pattern, Forwards Number of Stairs: 3 General stair comments: alternating ascending, step-to descending     Balance Overall balance assessment: Needs assistance Sitting-balance support: No upper extremity supported Sitting balance-Leahy Scale: Good     Standing balance support: No upper extremity supported Standing balance-Leahy Scale: Fair Standing balance comment: moderate tolerance of balance challenge                 Standardized Balance Assessment Standardized Balance  Assessment : Dynamic Gait Index   Dynamic Gait Index Level Surface: Normal Change in Gait Speed: Mild Impairment Gait with Horizontal Head Turns: Mild  Impairment Gait with Vertical Head Turns: Mild Impairment Gait and Pivot Turn: Normal Step Over Obstacle: Mild Impairment Step Around Obstacles: Normal Steps: Mild Impairment Total Score: 19       Pertinent Vitals/Pain Pain Assessment Pain Assessment: 0-10 Pain Score: 8  Pain Location: chronic neck pain Pain Descriptors / Indicators: Discomfort, Sore Pain Intervention(s): Limited activity within patient's tolerance, Monitored during session, Patient requesting pain meds-RN notified    Home Living Family/patient expects to be discharged to:: Private residence Living Arrangements: Spouse/significant other Available Help at Discharge: Family;Available PRN/intermittently (wife with dementia, two sons nearby) Type of Home: House Home Access: Stairs to enter Entrance Stairs-Rails: Doctor, General Practice of Steps: 2   Home Layout: One level Home Equipment: Agricultural Consultant (2 wheels);Rollator (4 wheels);Cane - single point;Toilet riser      Prior Function Prior Level of Function : Independent/Modified Independent;Driving             Mobility Comments: walking 2.5 miles per day for exercise, no DME no falls ADLs Comments: independent     Extremity/Trunk Assessment   Upper Extremity Assessment Upper Extremity Assessment: Overall WFL for tasks assessed    Lower Extremity Assessment Lower Extremity Assessment: Overall WFL for tasks assessed (grossly 4+/5 to MMT, no changes in sensation)    Cervical / Trunk Assessment Cervical / Trunk Assessment: Normal;Other exceptions Cervical / Trunk Exceptions: chronic neck and back pain  Communication   Communication Communication: No apparent difficulties    Cognition Arousal: Alert Behavior During Therapy: WFL for tasks assessed/performed   PT - Cognitive impairments: No apparent impairments                       PT - Cognition Comments: pt following instructions well in session, not formally  assessed Following commands: Intact       Cueing Cueing Techniques: Verbal cues     General Comments General comments (skin integrity, edema, etc.): HR to 100bpm with gait, upon standing in room at end of session, HR into afib rhythm with rates up to 147bpm. MD and RN aware    Exercises     Assessment/Plan    PT Assessment Patient needs continued PT services  PT Problem List Decreased strength;Decreased activity tolerance;Decreased balance;Decreased mobility       PT Treatment Interventions DME instruction;Gait training;Stair training;Functional mobility training;Therapeutic activities;Therapeutic exercise;Balance training;Patient/family education    PT Goals (Current goals can be found in the Care Plan section)  Acute Rehab PT Goals Patient Stated Goal: to return home PT Goal Formulation: With patient Time For Goal Achievement: 08/07/24 Potential to Achieve Goals: Good    Frequency Min 2X/week        AM-PAC PT 6 Clicks Mobility  Outcome Measure Help needed turning from your back to your side while in a flat bed without using bedrails?: None Help needed moving from lying on your back to sitting on the side of a flat bed without using bedrails?: None Help needed moving to and from a bed to a chair (including a wheelchair)?: A Little Help needed standing up from a chair using your arms (e.g., wheelchair or bedside chair)?: A Little Help needed to walk in hospital room?: A Little Help needed climbing 3-5 steps with a railing? : A Little 6 Click Score: 20    End of Session Equipment Utilized During  Treatment: Gait belt Activity Tolerance: Patient tolerated treatment well Patient left: in chair;with call bell/phone within reach Nurse Communication: Mobility status;Other (comment) (HR and rhythm) PT Visit Diagnosis: Unsteadiness on feet (R26.81);Muscle weakness (generalized) (M62.81)    Time: 9147-9083 PT Time Calculation (min) (ACUTE ONLY): 24 min   Charges:   PT  Evaluation $PT Eval Moderate Complexity: 1 Mod PT Treatments $Therapeutic Exercise: 8-22 mins PT General Charges $$ ACUTE PT VISIT: 1 Visit         Izetta Call, PT, DPT   Acute Rehabilitation Department Office [phone] Secure Chat Communication Preferred  Izetta JULIANNA Call 07/24/2024, 10:47 AM

## 2024-07-24 NOTE — Progress Notes (Addendum)
 [facility] Progress Note  CC:  Anemia, dark stools   Subjective: No BM since he prepped for the colonoscopy. No N/V. No abdominal pain. He has intermittent SOB, not currently. No CP. Patient went into atrial fib with RVR this am per cardiology. Daughter in-law at the bedside.    Objective:   EGD 07/21/2024: - Esophagogastric landmarks identified.  - 2 cm hiatal hernia.  - Z-line irregular. Possible [place] segment of BE - biopsies NOT taken to not confound the presentation in case capsule endoscopy is needed  - Normal stomach.  - Duodenal mucosal changes seen, suspicious for celiac disease.  - Markedly angulated duodenal sweep making for difficult visualization there - multiple passes made - no concerning pathology. No cause for anemia / bleeding noted on this exam.   Colonoscopy 07/22/2024: - Five 2 to 3 mm polyps in the transverse colon, removed with a cold snare. Resected and retrieved.  - Two non-bleeding colonic angiodysplastic lesions. Treated with a monopolar probe.  - Diverticulosis in the sigmoid colon and in the descending colon.  Vital signs in last 24 hours: Temp:  [97.6 F (36.4 C)-98 F (36.7 C)] 97.6 F (36.4 C) (11/17 0756) Pulse Rate:  [63-76] 76 (11/17 0428) Resp:  [17-18] 17 (11/17 0756) BP: (143-158)/(49-72) 143/49 (11/17 0756) SpO2:  [97 %] 97 % (11/16 2004) Weight:  [68.7 kg] 68.7 kg (11/17 0500) Last BM Date : 07/22/24 General: Alert 73 year old male sitting up in the chair in now acute distress.  Heart: Irregular rhythm, no murmur.  Pulm: Breath sounds clear. On room air.  Abdomen: Soft, nondistended. Positive bowel sounds x 4 quadrants. Questionable left lower abd hernia palpated when patient standing upright.  Extremities: No lower extremity edema. Neurologic:  Alert and oriented x 4. Grossly normal neurologically. Psych:  Alert and cooperative. Normal mood and affect.  Intake/Output from previous day: 11/16 0701 - 11/17  0700 In: 510 [P.O.:510] Out: 1375 [Urine:1375] Intake/Output this shift: Total I/O In: -  Out: 200 [Urine:200]  Lab Results: Recent Labs    07/22/24 0401 07/22/24 1541 07/23/24 0503 07/24/24 0527  WBC 10.0  --  8.6 8.0  HGB 9.6* 9.6* 9.6* 10.7*  HCT 28.8* 28.8* 28.7* 32.2*  PLT 305  --  266 349   BMET Recent Labs    07/22/24 0401 07/23/24 0503 07/24/24 0527  NA 145 138 136  K 4.0 3.6 4.2  CL 104 104 103  CO2 25 24 23   GLUCOSE 99 145* 134*  BUN 10 14 14   CREATININE 1.30* 1.37* 1.16  CALCIUM 8.9 8.1* 8.5*   LFT No results for input(s): PROT, ALBUMIN, AST, ALT, ALKPHOS, BILITOT, BILIDIR, IBILI in the last 72 hours. PT/INR No results for input(s): LABPROT, INR in the last 72 hours. Hepatitis Panel No results for input(s): HEPBSAG, HCVAB, HEPAIGM, HEPBIGM in the last 72 hours.  DG Chest 2 View Result Date: 07/23/2024 CLINICAL DATA:  Shortness of breath.  Pleural effusions. EXAM: DG CHEST 2V COMPARISON:  Radiographs and CT 07/17/2024. FINDINGS: The heart size and mediastinal contours are stable. Persistent bilateral pleural effusions appear slightly larger based on the lateral view. The overall pulmonary aeration has improved, and there is no edema, confluent airspace disease or pneumothorax. Postsurgical changes from previous lower cervical fusion and mild thoracic spine degenerative changes are noted. IMPRESSION: Slightly increased bilateral pleural effusions. No evidence of pneumonia or edema. Electronically Signed   By: Elsie Perone M.D.   On: 07/23/2024 11:27  Patient Profile: Oscar Moses is a 73 year old male with CKD, COPD, reported celiac disease, diabetes. Admitted 07/17/2024 with unstable angina, acute CHF, A-fib -while undergoing workup for this he is been on a Heparin drip and noted to have a downtrending hemoglobin. He endorsed having intermittent black stools at home. On baby aspirin at home, no PPI at baseline.   Assessment /  Plan:  73 year old male admitted with unstable angina, hypertensive urgency, acute diastolic heart failure, and paroxysmal atrial fibrillation. Echo 11/12 LVEF 55-60%, normal RV, small pericardial effusion, moderate LA enlargement. Patient went into afib with RVR this am per cardiology. Started on Amiodarone IV. Heparin previously discontinued secondary to GI bleed.  - Cardiac catheterization scheduled tomorrow  - Post cardiac cath, cardiology requesting further GI input regarding management of CAD with antiplatelet therapy and A-fib with anticoagulant therapy  Normocytic anemia, dark stools at home. Admission Hemoglobin 10.6 (down from 13.08 December 2022) -> 10.1 -> 8.9 -> 8.0 -> 7.7 -> 8.7 -> 7.9 -> 10 -> 9.6 -> today Hg 10.7. EGD 11/14 showed a 2 cm hiatal hernia, no etiology for anemia/melena.  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  Colonic AVMs likely source of anemia and dark stools. - Continue to monitor patient closely for GI bleeding  - CBC in am - Transfuse for Hg level < 8 - Continue Pantoprazole 40mg  po bid for now, ? reduce to once daily.  Await further recommendations per Dr. San.   AKI, resolved. Cr 1.16.   Prolonged QTc   Hypertension. Hypertensive urgency on admission.   Type 2 diabetes  COPD   ? 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 I performed a substantive portion of this encounter, including complete performance of at least one of the key components, in conjunction with the APP. I agree with the APP's note, impression, and recommendations with my edits.  EGD and colonoscopy completed on 11/16 and outlined as above.  EGD with 2 cm hiatal hernia, irregular Z-line (intentionally not biopsied), with markedly acute angulation of the duodenal sweep, but otherwise no UGI bleed.  Colonoscopy with 2 small nonbleeding ascending colon AVMs treated with APC along with 5 small polyps resected (path pending) and left-sided diverticulosis.  H/H stable today at 10.7/32 and no overt bleeding.  Did have RVR overnight and started on amnio drip.  Not on any anticoagulation due to recent GI bleed.  Scheduled for cardiac catheterization tomorrow.  - Will follow-up on cardiac catheterization results.  Will need to determine ongoing plans regarding antiplatelet therapy and anticoagulation depending on cath  results - Continue serial CBC checks with blood products as needed per protocol - Continue Protonix, but can reduce to daily dosing since no high risk UGI lesions noted on EGD - Will follow-up on path from colonoscopy - If needing VCE for additional small bowel interrogation, would likely need endoscopic placement given acute duodenal angulation and high risk for capsule retention  A total of 50 minutes of time was spent on this encounter, including in depth chart review, independent review of results as outlined above, communicating results with the patient directly, face-to-face time with the patient, coordinating care, and ordering studies and medications as appropriate, and  documentation.   [id number], DO, FACG [phone] office

## 2024-07-24 NOTE — Anesthesia Postprocedure Evaluation (Signed)
 Anesthesia Post Note  Patient: Oscar Moses  Procedure(s) Performed: COLONOSCOPY COLON, WITH ARGON PLASMA COAGULATION POLYPECTOMY, INTESTINE     Patient location during evaluation: PACU Anesthesia Type: MAC Level of consciousness: awake and alert Pain management: pain level controlled Vital Signs Assessment: post-procedure vital signs reviewed and stable Respiratory status: spontaneous breathing, nonlabored ventilation, respiratory function stable and patient connected to nasal cannula oxygen Cardiovascular status: stable and blood pressure returned to baseline Postop Assessment: no apparent nausea or vomiting Anesthetic complications: no   No notable events documented.  Last Vitals:  Vitals:   07/24/24 0424 07/24/24 0428  BP:  (!) 158/72  Pulse:  76  Resp:    Temp: 36.4 C   SpO2:      Last Pain:  Vitals:   07/24/24 0613  TempSrc:   PainSc: 8                  Clarinda Obi S

## 2024-07-24 NOTE — TOC Progression Note (Signed)
 Transition of Care [facility]) - Progression Note    Patient Details  Name: Oscar Moses MRN: [id number] Date of Birth: 04-22-1951  Transition of [facility] CM/SW Contact  Clotilda Brenner, [place] Phone Number: 07/24/2024, 10:18 AM  Clinical Narrative:   CSW spoke with patient at bedside to find out his transportation needs for discharge. Patient stated that his son Darel Chancy will pick him up at discharge.                       Expected Discharge Plan and Services                                               Social Drivers of Health (SDOH) Interventions SDOH Screenings   Food Insecurity: No Food Insecurity (07/18/2024)  Housing: Unknown (07/18/2024)  Transportation Needs: No Transportation Needs (07/18/2024)  Utilities: Not At Risk (07/18/2024)  Social Connections: Moderately Isolated (07/18/2024)  Tobacco Use: Medium Risk (07/21/2024)    Readmission Risk Interventions     No data to display

## 2024-07-24 NOTE — Evaluation (Signed)
 Occupational Therapy Evaluation Patient Details Name: Oscar Moses MRN: [id number] DOB: 04-18-1951 Today's Date: 07/24/2024   History of Present Illness   The pt is a 73 yo male presenting 11/10 with SOB and chest pressure. Work up revealed HTN crisis, BNP >2800, moderate bilateral pleural effusion, and brief run of afib. Admitted for management of unstable angina with acute CHF. S/p EGD 11/14, colonoscopy 11/15 showed angioplastic lesions in ascending colon that were coagulated, multiple diverticula and 3 small AVMs. 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     Clinical Impressions Pt is at Sup level with ADLs/selfcare and mobility. Pt iwht impaired activity tolerance.endurance and generalized weakness. PTA pt lives with his wife and was Ind with ADLs/selfcare, IADLs, home mgt, cooks, asissst wife who has dementia with bathing and dressing (sons and daughters in law assist with her as well), was walking 2.5 miles per day for exercise, no DME no falls. OT will follow acutely to maximize level of function and safety      If plan is discharge home, recommend the following:   Assistance with cooking/housework;Assist for transportation;Help with stairs or ramp for entrance     Functional Status Assessment   Patient has had a recent decline in their functional status and demonstrates the ability to make significant improvements in function in a reasonable and predictable amount of time.     Equipment Recommendations   None recommended by OT     Recommendations for Other Services         Precautions/Restrictions   Precautions Precautions: Fall Precaution/Restrictions Comments: wacth HR Restrictions Weight Bearing Restrictions Per Provider Order: No     Mobility Bed Mobility               General bed mobility comments: pt in chair    Transfers Overall transfer  level: Needs assistance Equipment used: None Transfers: Sit to/from Stand Sit to Stand: Supervision                  Balance                                           ADL either performed or assessed with clinical judgement   ADL Overall ADL's : Needs assistance/impaired Eating/Feeding: Independent   Grooming: Wash/dry hands;Wash/dry face;Supervision/safety   Upper Body Bathing: Modified independent   Lower Body Bathing: Supervison/ safety   Upper Body Dressing : Modified independent   Lower Body Dressing: Supervision/safety   Toilet Transfer: Supervision/safety;Ambulation   Toileting- Clothing Manipulation and Hygiene: Supervision/safety;Sit to/from stand       Functional mobility during ADLs: Supervision/safety General ADL Comments: no physical assist required     Vision Ability to See in Adequate Light: 0 Adequate Patient Visual Report: No change from baseline       Perception         Praxis         Pertinent Vitals/Pain Pain Assessment Pain Assessment: No/denies pain Pain Score: 5  Pain Location: chronic neck pain Pain Descriptors / Indicators: Discomfort, Sore Pain Intervention(s): Limited activity within patient's tolerance, Premedicated before session, Monitored during session, Repositioned     Extremity/Trunk Assessment Upper Extremity Assessment Upper Extremity Assessment: Overall WFL for tasks assessed   Lower Extremity Assessment Lower Extremity Assessment: Defer to PT evaluation   Cervical /  Trunk Assessment Cervical / Trunk Assessment: Normal;Other exceptions Cervical / Trunk Exceptions: chronic neck and back pain   Communication Communication Communication: No apparent difficulties   Cognition Arousal: Alert Behavior During Therapy: WFL for tasks assessed/performed Cognition: No apparent impairments                               Following commands: Intact       Cueing  General  Comments   Cueing Techniques: Verbal cues      Exercises     Shoulder Instructions      Home Living Family/patient expects to be discharged to:: Private residence Living Arrangements: Spouse/significant other Available Help at Discharge: Family;Available PRN/intermittently Type of Home: House Home Access: Stairs to enter [employer] of Steps: 2 Entrance Stairs-Rails: Right;Left Home Layout: One level     Bathroom Shower/Tub: Producer, Television/film/video: Standard     Home Equipment: Agricultural Consultant (2 wheels);Rollator (4 wheels);Cane - single point;Toilet riser   Additional Comments: Pt reports a few weeks of gradual decline and weakness prior to admission.Has been assisint wife with dementia for ~1 year      Prior Functioning/Environment Prior Level of Function : Independent/Modified Independent;Driving             Mobility Comments: walking 2.5 miles per day for exercise, no DME no falls ADLs Comments: Ind with ADLs/selfcare, IADLs, home mgt, cooks, asissst wife who has dementia with bathing and dressing (sons and daughters in law assist with her as well)    OT Problem List: Decreased activity tolerance   OT Treatment/Interventions: Self-care/ADL training;Energy conservation;Patient/family education;DME and/or AE instruction;Therapeutic activities      OT Goals(Current goals can be found in the care plan section)   Acute Rehab OT Goals Patient Stated Goal: go home OT Goal Formulation: With patient/family Time For Goal Achievement: 08/07/24 Potential to Achieve Goals: Good ADL Goals Additional ADL Goal #1: Pt will complete all ADLs/selfcare with set up - Mod I Additional ADL Goal #2: Pt will safely transfer to toilet and shower Mod I   OT Frequency:  Min 1X/week    Co-evaluation              AM-PAC OT 6 Clicks Daily Activity     Outcome Measure Help from another person eating meals?: None Help from another person taking care of  personal grooming?: A Little Help from another person toileting, which includes using toliet, bedpan, or urinal?: A Little Help from another person bathing (including washing, rinsing, drying)?: A Little Help from another person to put on and taking off regular upper body clothing?: A Little Help from another person to put on and taking off regular lower body clothing?: A Little 6 Click Score: 19   End of Session Equipment Utilized During Treatment: Gait belt Nurse Communication: Mobility status  Activity Tolerance: Patient tolerated treatment well Patient left: in chair;with call bell/phone within reach;with family/visitor present  OT Visit Diagnosis: Muscle weakness (generalized) (M62.81)                Time: 8947-8880 OT Time Calculation (min): 27 min Charges:  OT General Charges $OT Visit: 1 Visit OT Evaluation $OT Eval Low Complexity: 1 Low OT Treatments $Therapeutic Activity: 8-22 mins    Jacques Karna Loose 07/24/2024, 1:40 PM

## 2024-07-24 NOTE — Progress Notes (Addendum)
 PROGRESS NOTE    Oscar Moses  FMW:[id number] DOB: March 11, 1951 DOA: 07/17/2024 PCP: Onita Norleen, MD  Chief Complaint  Patient presents with   Shortness of Breath    Brief Narrative:   31 male HTN, HLD, CKD 2, GERD, DM2, Depression, celiac disease, COPD, anemia, low back pain admitted for shortness of breath.  Upon admission noted to have signs of volume overload with moderate bilateral pleural effusion/small pericardial effusion.  No PE seen on the CTA.  Cardiology was consulted and cautiously diuresing due to AKI. Due to persistent anemia, GI team consulted for their input as patient will need to be on anticoagulation.  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  Overall troponins are flat but there is concerns of ACS.  Plans for RHC/LHC but on hold due to AKI and anemia. - Echocardiogram with preserved ef, no RWMA. - CXR with slightly increased bilateral effusions - follow with lasix today - per cards, plan for cath 11/18, awaiting input from GI as to management of CAD with antiplatelet therapy and afib with anticoagulant therapy -A1c 9.6, LDL 31   Anemia Hb fluctuating - relatively stable (has not actually required any transfusion) Reported dark stools GI eval underway - s/p EGD with possible  Hilbun segment of barrett's esophagus, duodenal mucosal changes concerning for celiac disease.  No cause ofr anemia, bleeding noted.  Colonoscopy with 2 nonbleeding colonic angiodysplastic lesions, diverticulosis.  Five 2-3 mm polyps in transverse colon, removed with cold snare.  Needs repeat colonoscopy in 3 years. Anticoagulation currently on hold Needs GI follow up in 1-2 weeks with [facility] - AVM's suspected possible source of melena Appreciate GI input on possible antiplatelets/anticoagulant therapy in anticipation of cath tmrw   AKI Creatinine peaked at 2.24 Creatinine 1.3 today Will continue to trend UA with 0-5 RBC, 300 mg/dl protein  Hypertension ?Hypertension emergency Nitroglycerin gtt now d/c'd Toprol-XL, Aldactone, hydralazine.   Atrial fibrillation, new onset Holding anticoagulation in setting of concern for GI bleeding metoprolol   Insulin-dependent diabetes mellitus type 2, uncontrolled due to hyperglycemia -basal, bolus, and SSI - adjust prn    Hyperlipidemia -simvastatin   Prolonged QTc -Resolved  Celiac disease - Noted   COPD -As needed bronchodilators - no si/sx exacerbation   Chronic Pain - family notes some concern he may be taking extra from wife - PDMP reviewed, he's prescribed enough for q6hr prn dosing (he told me q4 prn - will discuss additionally 11/18).  We transitioned him to oxy today due to uncontrolled back pain - he's aware we will not prescribe this at discharge with his chronic pain meds.     DVT prophylaxis: SCD Code Status: full Family Communication: none Disposition:   Status is: Inpatient Remains inpatient appropriate because: need for continued inpatient care   Consultants:  cardiology  Procedures:  EGD  11/14  Echo IMPRESSIONS     1. Left ventricular ejection fraction, by estimation, is 55 to 60%. The  left ventricle has normal function. The left ventricle has no regional  wall motion abnormalities. Left  ventricular diastolic parameters are  consistent with Grade I diastolic  dysfunction (impaired relaxation).   2. Right ventricular systolic function is normal. The right ventricular  size is normal.   3. Left atrial size was moderately dilated.   4. Right atrial size was mildly dilated.   5. Oscar Moses small pericardial effusion is present. There is no evidence of  cardiac tamponade.   6. The mitral valve is normal in structure. Mild mitral valve  regurgitation. No evidence of mitral stenosis.   7. The aortic valve is tricuspid. Aortic valve regurgitation is not  visualized. No aortic stenosis is present.   8. The inferior vena cava is dilated in size with <50% respiratory  variability, suggesting right atrial pressure of 15 mmHg.   Antimicrobials:  Anti-infectives (From admission, onward)    None       Subjective: C/o SOB last night PND C/o back pain sitting up in chair  Objective: Vitals:   07/24/24 0756 07/24/24 0900 07/24/24 1213 07/24/24 1547  BP: (!) 143/49 (!) 150/72 (!) 141/60 (!) 110/59  Pulse:      Resp: 17  18 17   Temp: 97.6 F (36.4 C)  (!) 97.4 F (36.3 C) 97.8 F (36.6 C)  TempSrc: Oral  Oral Oral  SpO2:      Weight:      Height:        Intake/Output Summary (Last 24 hours) at 07/24/2024 1625 Last data filed at 07/24/2024 1453 Gross per 24 hour  Intake 0 ml  Output 1650 ml  Net -1650 ml   Filed Weights   07/22/24 0305 07/23/24 0424 07/24/24 0500  Weight: 68.9 kg 70.7 kg 68.7 kg    Examination:  General: No acute distress. Cardiovascular: RRR Lungs: unlabored, CTAB Neurological: Alert and oriented 3. Moves all extremities 4 with equal strength. Cranial nerves II through XII grossly intact. Extremities: No clubbing or cyanosis. No edema.   Data Reviewed: I have personally reviewed following labs and imaging studies  CBC: Recent Labs  Lab 07/20/24 0351 07/20/24 1547 07/21/24 0308 07/21/24 1608 07/21/24 1818 07/22/24 0401 07/22/24 1541  07/23/24 0503 07/24/24 0527  WBC 5.7  --  5.0  --   --  10.0  --  8.6 8.0  HGB 7.8*   < > 7.9*   < > 9.3* 9.6* 9.6* 9.6* 10.7*  HCT 22.7*   < > 23.7*   < > 27.5* 28.8* 28.8* 28.7* 32.2*  MCV 96.2  --  96.7  --   --  96.0  --  97.0 97.9  PLT 225  --  243  --   --  305  --  266 349   < > = values in this interval not displayed.    Basic Metabolic Panel: Recent Labs  Lab 07/18/24 1406 07/19/24 0420 07/20/24 0351 07/21/24 0308 07/22/24 0401 07/23/24 0503 07/24/24 0527  NA 137   < > 137 139 145 138 136  K 3.8   < > 3.7 4.6 4.0 3.6 4.2  CL 103   < > 103 105 104 104 103  CO2 24   < > 25 25 25 24 23   GLUCOSE 151*   < > 91 104* 99 145* 134*  BUN 16   < > 26* 18 10 14  14  CREATININE 1.49*   < > 1.97* 1.53* 1.30* 1.37* 1.16  CALCIUM 8.2*   < > 7.9* 8.2* 8.9 8.1* 8.5*  MG 1.9  --  2.2  --   --   --   --   PHOS  --   --  4.9*  --   --   --   --    < > = values in this interval not displayed.    GFR: Estimated Creatinine Clearance: 55.1 mL/min (by C-G formula based on SCr of 1.16 mg/dL).  Liver Function Tests: Recent Labs  Lab 07/18/24 1406 07/19/24 0420  AST 27 21  ALT 25 21  ALKPHOS 60 60  BILITOT 0.5 0.7  PROT 5.9* 5.3*  ALBUMIN 2.8* 2.4*    CBG: Recent Labs  Lab 07/23/24 2349 07/24/24 0405 07/24/24 0754 07/24/24 1211 07/24/24 1545  GLUCAP 136* 101* 304* 146* 156*     No results found for this or any previous visit (from the past 240 hours).       Radiology Studies: DG Chest 2 View Result Date: 07/23/2024 CLINICAL DATA:  Shortness of breath.  Pleural effusions. EXAM: DG CHEST 2V COMPARISON:  Radiographs and CT 07/17/2024. FINDINGS: The heart size and mediastinal contours are stable. Persistent bilateral pleural effusions appear slightly larger based on the lateral view. The overall pulmonary aeration has improved, and there is no edema, confluent airspace disease or pneumothorax. Postsurgical changes from previous lower cervical fusion and mild thoracic  spine degenerative changes are noted. IMPRESSION: Slightly increased bilateral pleural effusions. No evidence of pneumonia or edema. Electronically Signed   By: Elsie Perone M.D.   On: 07/23/2024 11:27        Scheduled Meds:  acetaminophen  1,000 mg Oral Q8H   amiodarone  200 mg Oral BID   amLODipine  10 mg Oral Daily   [START ON 07/25/2024] aspirin  81 mg Oral Pre-Cath   aspirin EC  81 mg Oral Daily   [START ON 07/25/2024] free water  500 mL Oral Once   furosemide  20 mg Intravenous Once   hydrALAZINE  100 mg Oral Q8H   insulin aspart  0-15 Units Subcutaneous Q4H   [START ON 07/25/2024] insulin aspart  4 Units Subcutaneous TID WC   insulin glargine-yfgn  14 Units Subcutaneous QHS   lidocaine  3 patch Transdermal Q24H   metoprolol succinate  50 mg Oral Daily   pantoprazole  40 mg Oral BID   polyethylene glycol  17 g Oral Daily   potassium chloride  20 mEq Oral Once   senna-docusate  2 tablet Oral QHS   simvastatin  20 mg Oral q morning   sodium chloride  flush  3 mL Intravenous Q12H   Continuous Infusions:     LOS: 6 days    Time spent: over 30 min     Meliton Monte, MD [facility]   To contact the attending provider between 7A-7P or the covering provider during after hours 7P-7A, please log into the web site [email or web address] and access using universal [place] password for that web site. If you do not have the password, please call the hospital operator.  07/24/2024, 4:25 PM

## 2024-07-24 NOTE — Progress Notes (Signed)
 Progress Note  Patient Name: Oscar Moses Date of Encounter: 07/24/2024 [facility] Cardiologist: None   Interval Summary    Feels much better, hopeful to DC home today. Needs to ambulate.  Vital Signs Vitals:   07/24/24 0424 07/24/24 0428 07/24/24 0500 07/24/24 0756  BP:  (!) 158/72  (!) 143/49  Pulse:  76    Resp:    17  Temp: 97.6 F (36.4 C)   97.6 F (36.4 C)  TempSrc: Oral   Oral  SpO2:      Weight:   68.7 kg   Height:        Intake/Output Summary (Last 24 hours) at 07/24/2024 0823 Last data filed at 07/24/2024 0736 Gross per 24 hour  Intake 150 ml  Output 1275 ml  Net -1125 ml      07/24/2024    5:00 AM 07/23/2024    4:24 AM 07/22/2024    3:05 AM  Last 3 Weights  Weight (lbs) 151 lb 6.4 oz 155 lb 13.8 oz 151 lb 14.4 oz  Weight (kg) 68.675 kg 70.7 kg 68.901 kg      Telemetry/ECG   Sinus Rhythm, occ PVCs - Personally Reviewed  Physical Exam  GEN: No acute distress.   Neck: No JVD Cardiac: RRR, no murmurs, rubs, or gallops.  Respiratory: Clear to auscultation bilaterally. GI: Soft, nontender, non-distended  MS: No edema  Assessment & Plan   73 y.o. male with a hx of hx of hypertension, hyperlipidemia, T1DM with retinopathy and nephropathy, chronic anemia, depression, COPD, celiac disease, insomnia, GERD, osteoarthritis, and hx of tobacco use who was initially seen 07/18/2024 for the evaluation of UA at the request of [name].    Unstable angina -- Reported intermittent episodes of angina over the past several months -- hsTn 31>>26, EKG with no acute ST/T wave abnormalities -- Started on IV heparin, aspirin, statin -- Initially planned for right and left heart cath but creatinine increased from 1.4 to 2.24 and cath deferred, likely outpatient setting.    Acute diastolic CHF -- Presents with worsening shortness of breath over the past several weeks -- proBNP 2857, chest x-ray with right basilar opacities. CT chest with  bilateral effusions  -- s/p IV lasix, held in the setting of elevated Cr. Remains euvolemic on exam -- Echo 11/12 LVEF 55-60%, normal RV, small pericardial effusion, moderate LA enlargement   Paroxsymal atrial fibrillation -- Developed episode of atrial fibrillation with RVR while in the ED and then brief recurrent episode the following day with conversion to sinus rhythm spontaneously -- No further episodes, anticoagulation on hold with GI bleeding. Will need to further address outpatient -- continue metoprolol XL 50mg  daily  -- outpt monitor at discharge?   Hypertensive urgency -- Significantly elevated in the 200s systolic initially. -- Home ACE inhibitor held at this time -- continue metoprolol XL 50mg  daily, hydralazine 100mg  TID, amlodipine increase to 10mg  daily    Hyperlipidemia -- 06/2024 shows LDL 37, HDL 51 -- Continue Zocor 20 mg   Anemia -- Baseline hemoglobin around 13, down to 7.7 this admission. Improved to 10.7 -- seen by GI, no cause of anemia on EGD, colonoscopy showed angioplastic lesions in ascending colon that were coagulated, multiple diverticula and 3 small AVMs  -- plans for outpatient GI follow up in 2 weeks   AKI -- Cr peaked at 2.24, improved to 1.16 today  Needs to ambulate today, will arrange outpatient follow up   For questions or updates, please  contact [facility] Please consult [email or web address] for contact info under      Signed, Manuelita Rummer, NP

## 2024-07-24 NOTE — Care Management Important Message (Signed)
 Important Message  Patient Details  Name: KEYMANI MCLEAN MRN: [id number] Date of Birth: 1951-07-06   Important Message Given:  Yes - Medicare IM     Vonzell Arrie Sharps 07/24/2024, 11:20 AM

## 2024-07-24 NOTE — Inpatient Diabetes Management (Signed)
 Inpatient Diabetes Program Recommendations  AACE/ADA: New Consensus Statement on Inpatient Glycemic Control (2015)  Target Ranges:  Prepandial:   less than 140 mg/dL      Peak postprandial:   less than 180 mg/dL (1-2 hours)      Critically ill patients:  140 - 180 mg/dL   Lab Results  Component Value Date   GLUCAP 304 (H) 07/24/2024   HGBA1C 9.6 (H) 07/17/2024    Diabetes history: DM1(does not make insulin. Needs correction, basal and meal coverage)   Outpatient Diabetes medications: FreeStyle Libre   Tresiba 12 units every day Novolog 0-20 units TID   Current orders for Inpatient glycemic control:  Tresiba 12 units every day Novolog 0-15 units Q4H  Inpatient Diabetes Program Recommendations:   Noted patient scheduled for heart cath in am so will be NPO again after midnight. Please consider: -Increase Semglee to 14 units daily -Add Novolog 4 units tid meal coverage when eats 50% meal  Thank you, Joon Pohle E. Hawley Pavia, RN, MSN, CNS, CDCES  Diabetes Coordinator Inpatient Glycemic Control Team Team Pager [phone] (8am-5pm) 07/24/2024 11:00 AM

## 2024-07-24 NOTE — Plan of Care (Signed)
  Problem: Education: Goal: Ability to describe self-care measures that may prevent or decrease complications (Diabetes Survival Skills Education) will improve Outcome: Progressing   Problem: Fluid Volume: Goal: Ability to maintain a balanced intake and output will improve Outcome: Progressing

## 2024-07-25 ENCOUNTER — Other Ambulatory Visit (HOSPITAL_COMMUNITY): Payer: Self-pay

## 2024-07-25 DIAGNOSIS — K922 Gastrointestinal hemorrhage, unspecified: Secondary | ICD-10-CM | POA: Diagnosis not present

## 2024-07-25 DIAGNOSIS — I480 Paroxysmal atrial fibrillation: Secondary | ICD-10-CM | POA: Diagnosis not present

## 2024-07-25 DIAGNOSIS — I214 Non-ST elevation (NSTEMI) myocardial infarction: Secondary | ICD-10-CM | POA: Diagnosis not present

## 2024-07-25 DIAGNOSIS — K5520 Angiodysplasia of colon without hemorrhage: Secondary | ICD-10-CM | POA: Diagnosis not present

## 2024-07-25 LAB — CBC
HCT: 29.4 % — ABNORMAL LOW (ref 39.0–52.0)
Hemoglobin: 9.7 g/dL — ABNORMAL LOW (ref 13.0–17.0)
MCH: 32.2 pg (ref 26.0–34.0)
MCHC: 33 g/dL (ref 30.0–36.0)
MCV: 97.7 fL (ref 80.0–100.0)
Platelets: 297 K/uL (ref 150–400)
RBC: 3.01 MIL/uL — ABNORMAL LOW (ref 4.22–5.81)
RDW: 12.5 % (ref 11.5–15.5)
WBC: 6.4 K/uL (ref 4.0–10.5)
nRBC: 0 % (ref 0.0–0.2)

## 2024-07-25 LAB — BASIC METABOLIC PANEL WITH GFR
Anion gap: 15 (ref 5–15)
BUN: 17 mg/dL (ref 8–23)
CO2: 23 mmol/L (ref 22–32)
Calcium: 8.5 mg/dL — ABNORMAL LOW (ref 8.9–10.3)
Chloride: 100 mmol/L (ref 98–111)
Creatinine, Ser: 1.51 mg/dL — ABNORMAL HIGH (ref 0.61–1.24)
GFR, Estimated: 48 mL/min — ABNORMAL LOW (ref >60)
Glucose, Bld: 86 mg/dL (ref 70–99)
Potassium: 3.8 mmol/L (ref 3.5–5.1)
Sodium: 138 mmol/L (ref 135–145)

## 2024-07-25 LAB — TYPE AND SCREEN
ABO/RH(D): O POS
Antibody Screen: NEGATIVE
Unit division: 0

## 2024-07-25 LAB — BPAM RBC
Blood Product Expiration Date: 202512122359
Unit Type and Rh: 5100

## 2024-07-25 LAB — HEPATIC FUNCTION PANEL
ALT: 20 U/L (ref 0–44)
AST: 23 U/L (ref 15–41)
Albumin: 2.7 g/dL — ABNORMAL LOW (ref 3.5–5.0)
Alkaline Phosphatase: 50 U/L (ref 38–126)
Bilirubin, Direct: <0.1 mg/dL (ref 0.0–0.2)
Total Bilirubin: 0.6 mg/dL (ref 0.0–1.2)
Total Protein: 5.7 g/dL — ABNORMAL LOW (ref 6.5–8.1)

## 2024-07-25 LAB — GLUCOSE, CAPILLARY
Glucose-Capillary: 88 mg/dL (ref 70–99)
Glucose-Capillary: 144 mg/dL — ABNORMAL HIGH (ref 70–99)
Glucose-Capillary: 239 mg/dL — ABNORMAL HIGH (ref 70–99)
Glucose-Capillary: 122 mg/dL — ABNORMAL HIGH (ref 70–99)

## 2024-07-25 LAB — MAGNESIUM: Magnesium: 1.8 mg/dL (ref 1.7–2.4)

## 2024-07-25 LAB — SURGICAL PATHOLOGY

## 2024-07-25 LAB — TSH: TSH: 5.323 u[IU]/mL — ABNORMAL HIGH (ref 0.350–4.500)

## 2024-07-25 SURGERY — LEFT HEART CATH AND CORONARY ANGIOGRAPHY
Anesthesia: LOCAL

## 2024-07-25 MED ORDER — FREE WATER
500.0000 mL | Freq: Once | Status: AC
  Administered 2024-07-25: 500 mL via ORAL

## 2024-07-25 MED ORDER — HYDRALAZINE HCL 100 MG PO TABS
100.0000 mg | ORAL_TABLET | Freq: Three times a day (TID) | ORAL | 0 refills | Status: DC
  Filled 2024-07-25: qty 90, 30d supply, fill #0

## 2024-07-25 MED ORDER — AMIODARONE HCL 200 MG PO TABS
ORAL_TABLET | ORAL | 0 refills | Status: DC
  Filled 2024-07-25: qty 50, 40d supply, fill #0

## 2024-07-25 MED ORDER — RIVAROXABAN 15 MG PO TABS
15.0000 mg | ORAL_TABLET | Freq: Every day | ORAL | 0 refills | Status: DC
  Filled 2024-07-25: qty 30, 30d supply, fill #0

## 2024-07-25 MED ORDER — POTASSIUM CHLORIDE CRYS ER 20 MEQ PO TBCR
20.0000 meq | EXTENDED_RELEASE_TABLET | Freq: Once | ORAL | Status: AC
  Administered 2024-07-25: 20 meq via ORAL
  Filled 2024-07-25: qty 1

## 2024-07-25 MED ORDER — MAGNESIUM SULFATE 2 GM/50ML IV SOLN
2.0000 g | Freq: Once | INTRAVENOUS | Status: AC
  Administered 2024-07-25: 2 g via INTRAVENOUS
  Filled 2024-07-25: qty 50

## 2024-07-25 MED ORDER — AMLODIPINE BESYLATE 10 MG PO TABS
10.0000 mg | ORAL_TABLET | Freq: Every day | ORAL | 0 refills | Status: DC
  Filled 2024-07-25: qty 30, 30d supply, fill #0

## 2024-07-25 MED ORDER — POTASSIUM CHLORIDE CRYS ER 10 MEQ PO TBCR
10.0000 meq | EXTENDED_RELEASE_TABLET | Freq: Every day | ORAL | 0 refills | Status: AC
  Filled 2024-07-25: qty 30, 30d supply, fill #0

## 2024-07-25 MED ORDER — FUROSEMIDE 20 MG PO TABS
20.0000 mg | ORAL_TABLET | Freq: Every day | ORAL | 0 refills | Status: DC
  Filled 2024-07-25: qty 30, 30d supply, fill #0

## 2024-07-25 MED ORDER — METOPROLOL SUCCINATE ER 50 MG PO TB24
50.0000 mg | ORAL_TABLET | Freq: Every day | ORAL | 0 refills | Status: DC
  Filled 2024-07-25: qty 30, 30d supply, fill #0

## 2024-07-25 NOTE — Progress Notes (Signed)
 Physical Therapy Treatment Patient Details Name: Oscar Moses MRN: [id number] DOB: [birth year] Today's Date: 07/25/2024   History of Present Illness The pt is a 73 yo male presenting 11/10 with SOB and chest pressure. Work up revealed HTN crisis, BNP >2800, moderate bilateral pleural effusion, and brief run of afib. Admitted for management of unstable angina with acute CHF. S/p EGD 11/14, colonoscopy 11/15 showed angioplastic lesions in ascending colon that were coagulated, multiple diverticula and 3 small AVMs. 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    PT Comments  The pt was agreeable to session, reports has been mobilizing well since last session and is hopeful for progression to d/c home today. The pt was able to complete increased ambulation distance with supervision and increased challenges of balance and speed intervals with HR stable. Pt educated on progressive return to exercise after anticipated d/c home and expressed understanding.     If plan is discharge home, recommend the following: Assistance with cooking/housework;Assist for transportation;Help with stairs or ramp for entrance   Can travel by private vehicle        Equipment Recommendations  None recommended by PT    Recommendations for Other Services       Precautions / Restrictions Precautions Precautions: Fall Recall of Precautions/Restrictions: Intact Precaution/Restrictions Comments: watch heart rhythm Restrictions Weight Bearing Restrictions Per Provider Order: No     Mobility  Bed Mobility Overal bed mobility: Independent             General bed mobility comments: no assist, slightly increased time    Transfers Overall transfer level: Needs assistance Equipment used: None Transfers: Sit to/from Stand Sit to Stand: Supervision           General transfer comment: supervision in session, no UE  support or increased time.    Ambulation/Gait Ambulation/Gait assistance: Supervision Gait Distance (Feet): 300 Feet Assistive device: None Gait Pattern/deviations: Step-through pattern Gait velocity: 0.83 m/s Gait velocity interpretation: >2.62 ft/sec, indicative of community ambulatory   General Gait Details: pt with good stability, able to tolerate speed intervals, balance challenges, and stairs without need for UE support. HR to 98bpm in NSR   Stairs Stairs: Yes Stairs assistance: Supervision Stair Management: One rail Right, Alternating pattern, Step to pattern, Forwards Number of Stairs: 3 General stair comments: alternating ascending, step-to descending   Wheelchair Mobility     Tilt Bed    Modified Rankin (Stroke Patients Only)       Balance Overall balance assessment: Needs assistance Sitting-balance support: No upper extremity supported Sitting balance-Leahy Scale: Normal     Standing balance support: No upper extremity supported Standing balance-Leahy Scale: Fair Standing balance comment: moderate tolerance of balance challenge                 Standardized Balance Assessment Standardized Balance Assessment : Dynamic Gait Index   Dynamic Gait Index Level Surface: Normal Change in Gait Speed: Normal Gait with Horizontal Head Turns: Mild Impairment Gait with Vertical Head Turns: Normal Gait and Pivot Turn: Normal Step Over Obstacle: Mild Impairment Step Around Obstacles: Normal Steps: Mild Impairment Total Score: 21      Communication Communication Communication: No apparent difficulties  Cognition Arousal: Alert Behavior During Therapy: WFL for tasks assessed/performed   PT - Cognitive impairments: No apparent impairments  PT - Cognition Comments: pt following instructions well in session, not formally assessed Following commands: Intact      Cueing Cueing Techniques: Verbal cues  Exercises       General Comments General comments (skin integrity, edema, etc.): VSS on RA, HR 98 bpm in NSR      Pertinent Vitals/Pain Pain Assessment Pain Assessment: Faces Faces Pain Scale: Hurts little more Pain Location: chronic neck pain Pain Descriptors / Indicators: Discomfort, Sore Pain Intervention(s): Limited activity within patient's tolerance, Monitored during session, Premedicated before session, Repositioned     PT Goals (current goals can now be found in the care plan section) Acute Rehab PT Goals Patient Stated Goal: to return home PT Goal Formulation: With patient Time For Goal Achievement: 08/07/24 Potential to Achieve Goals: Good Progress towards PT goals: Progressing toward goals    Frequency    Min 2X/week       AM-PAC PT 6 Clicks Mobility   Outcome Measure  Help needed turning from your back to your side while in a flat bed without using bedrails?: None Help needed moving from lying on your back to sitting on the side of a flat bed without using bedrails?: None Help needed moving to and from a bed to a chair (including a wheelchair)?: None Help needed standing up from a chair using your arms (e.g., wheelchair or bedside chair)?: None Help needed to walk in hospital room?: A Little Help needed climbing 3-5 steps with a railing? : A Little 6 Click Score: 22    End of Session Equipment Utilized During Treatment: Gait belt Activity Tolerance: Patient tolerated treatment well Patient left: in chair;with call bell/phone within reach Nurse Communication: Mobility status PT Visit Diagnosis: Unsteadiness on feet (R26.81);Muscle weakness (generalized) (M62.81)     Time: 8855-8841 PT Time Calculation (min) (ACUTE ONLY): 14 min  Charges:    $Therapeutic Exercise: 8-22 mins PT General Charges $$ ACUTE PT VISIT: 1 Visit                     Izetta Call, PT, DPT   Acute Rehabilitation Department Office [phone] Secure Chat Communication  Preferred   Izetta JULIANNA Call 07/25/2024, 1:25 PM

## 2024-07-25 NOTE — Progress Notes (Signed)
 Progress Note  Patient Name: Oscar Moses Date of Encounter: 07/25/2024 [facility] Cardiologist: None   Interval Summary    No chest pain, breathing is stable. Initially planned for cath today but Cr up.   Vital Signs Vitals:   07/25/24 0000 07/25/24 0438 07/25/24 0707 07/25/24 0741  BP: 135/66  (!) 118/46 (!) 137/55  Pulse:      Resp: 20 20  18   Temp: 98 F (36.7 C) 97.9 F (36.6 C)  97.7 F (36.5 C)  TempSrc: Oral Oral  Oral  SpO2:      Weight:      Height:        Intake/Output Summary (Last 24 hours) at 07/25/2024 0815 Last data filed at 07/25/2024 9290 Gross per 24 hour  Intake 3824.28 ml  Output 2950 ml  Net 874.28 ml      07/24/2024    5:00 AM 07/23/2024    4:24 AM 07/22/2024    3:05 AM  Last 3 Weights  Weight (lbs) 151 lb 6.4 oz 155 lb 13.8 oz 151 lb 14.4 oz  Weight (kg) 68.675 kg 70.7 kg 68.901 kg      Telemetry/ECG   Sinus Rhythm, no further atrial fibrillation since yesterday morning - Personally Reviewed  Physical Exam  GEN: No acute distress.   Neck: No JVD Cardiac: RRR, no murmurs, rubs, or gallops.  Respiratory: Clear to auscultation bilaterally. GI: Soft, nontender, non-distended  MS: No edema  Assessment & Plan   73 y.o. male with a hx of hx of hypertension, hyperlipidemia, T1DM with retinopathy and nephropathy, chronic anemia, depression, COPD, celiac disease, insomnia, GERD, osteoarthritis, and hx of tobacco use who was initially seen 07/18/2024 for the evaluation of UA at the request of [name].    Unstable angina -- Reported intermittent episodes of angina over the past several months -- hsTn 31>>26, EKG with no acute ST/T wave abnormalities -- Started on IV heparin, aspirin, statin -- Initially planned for right and left heart cath but creatinine increased from 1.4 to 2.24 and cath deferred. Cr Improved to 1.1 and was scheduled for today, received IV lasix yesterday and Cr up 1.5 today. Given this rise in  Cr, will defer inpatient work up and set up for outpatient follow up with recheck of labs and determination of timing based on this    Acute diastolic CHF -- Presents with worsening shortness of breath over the past several weeks -- on admission, proBNP 2857, chest x-ray with right basilar opacities. CT chest with bilateral effusions  -- s/p IV lasix, held in the setting of elevated Cr. Remains euvolemic on exam. Received IV lasix 20mg  x1 yesterday after CXR with pleural effusions. 2.4L UOP overnight.  -- Echo 11/12 LVEF 55-60%, normal RV, small pericardial effusion, moderate LA enlargement   Paroxsymal atrial fibrillation -- Developed episode of atrial fibrillation with RVR while in the ED and then brief recurrent episode the following day with conversion to sinus rhythm spontaneously. Additional recurrent episode yesterday morning with ambulation that resolved spontaneously. -- started on amiodarone 200mg  BID, anticoagulation on hold with GI bleeding.  -- continue metoprolol XL 50mg  daily  -- outpt monitor at discharge?   Hypertensive urgency -- Significantly elevated in the 200s systolic initially. -- Home ACE inhibitor held at this time -- continue metoprolol XL 50mg  daily, hydralazine 100mg  TID, amlodipine increase to 10mg  daily    Hyperlipidemia -- 06/2024 shows LDL 37, HDL 51 -- Continue Zocor 20 mg   Anemia --  Baseline hemoglobin around 13, down to 7.7 this admission. Improved to 10.7 -- seen by GI, no cause of anemia on EGD, colonoscopy showed angioplastic lesions in ascending colon that were coagulated, multiple diverticula and 3 small AVMs  -- continue daily PPI -- plans for outpatient GI follow up in 2 weeks   AKI -- Cr peaked at 2.24, improved to 1.16, back to to 1.5 today after IV lasix    For questions or updates, please contact [facility] Please consult [email or web address] for contact info under    Signed, Manuelita Rummer, NP

## 2024-07-25 NOTE — Plan of Care (Signed)

## 2024-07-25 NOTE — Progress Notes (Signed)
 Mobility Specialist Progress Note;   07/25/24 1052  Mobility  Activity Ambulated independently  Level of Assistance Standby assist, set-up cues, supervision of patient - no hands on  Assistive Device None  Distance Ambulated (ft) 400 ft  Activity Response Tolerated well  Mobility Referral Yes  Mobility visit 1 Mobility  Mobility Specialist Start Time (ACUTE ONLY) 1052  Mobility Specialist Stop Time (ACUTE ONLY) 1104  Mobility Specialist Time Calculation (min) (ACUTE ONLY) 12 min   Pt agreeable to mobility. Required no physical assistance during ambulation, SV for safety. HR up to 106 during activity, no c/o when asked. Pt returned back to bed and left with all needs met, call bell in reach.   Lauraine Erm Mobility Specialist Please contact via SecureChat or [employer] [phone]

## 2024-07-25 NOTE — Discharge Summary (Signed)
 Physician Discharge Summary  Oscar Moses FMW:[id number] DOB: Nov 30, 1950 DOA: 07/17/2024  PCP: Onita Norleen, MD  Admit date: 07/17/2024 Discharge date: 07/25/2024  Time spent: 40 minutes  Recommendations for Outpatient Follow-up:  Follow outpatient CBC/CMP  Follow with cardiology outpatient - plan for outpatient cath Follow with gastroenterology outpatient - concern for barretts esophagus, results of path from polyp removal  Follow Hb/Hct on xarelto Amiodarone per cards recs Follow volume, creatinine, lytes - adjust lasix as indicated Adjust BP meds as indicated Repeat imaging outpatient to follow bilateral effusions   Discharge Diagnoses:  Principal Problem:   NSTEMI (non-ST elevated myocardial infarction) (HCC) Active Problems:   Stage 2 chronic kidney disease   Chronic obstructive pulmonary disease (HCC)   Celiac disease   Essential hypertension   Gastro-esophageal reflux disease without esophagitis   Hyperlipidemia   Major depressive disorder, single episode, in full remission   Anemia   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    Upon admission noted to have signs of volume overload with moderate bilateral pleural effusion/small pericardial effusion.  No PE seen on the CTA.  Cardiology was consulted and cautiously diuresing due to AKI.  Concern  for unstable angina given CP on admission.  Cath was planned but deferred in setting of his anemia and AKI.  He's now s/p EGD and colonoscopy, revealing of 2 nonbleeding AVMs in the colon which were treated.    He's stable at this time, planned for cath 11/18, but creatinine bumped after IV lasix on 11/17 (symptomatically improved from SOB standpoint).  Plan at this point is to discharge home on xarelto for his atrial fibrillation after discussion with GI/cardiology.  He'll also discharge with lasix for his overload.   Hospital Course:  Assessment and Plan:  Unstable angina Congestive heart failure Acute respiratory failure with hypoxia  Moderate Bilateral Effusions Patient has been seen by cardiology team.  Overall troponins are flat but there is concerns of ACS.  Plans for RHC/LHC but on hold due to AKI and anemia. - Echocardiogram with preserved ef, no RWMA. - CXR with slightly increased bilateral effusions  - will discharge with lasix 20 mg PO daily, follow creatinine closely - given bump in creatinine, plan for outpatient elective R/L heart cath when creatinine stable -A1c 9.6, LDL 31   Anemia Hb fluctuating - relatively stable (has not actually required any transfusion) Reported dark stools GI eval underway - s/p EGD with possible [place] segment of barrett's esophagus, duodenal mucosal changes concerning for celiac disease.  No cause ofr anemia, bleeding noted.  Colonoscopy with 2 nonbleeding colonic angiodysplastic lesions, diverticulosis.  Five 2-3 mm polyps in transverse colon, removed with cold snare.  Needs repeat colonoscopy in 3 years. Anticoagulation currently on hold Needs GI follow up in 1-2 weeks with [facility] - AVM's suspected possible source of melena Appreciate GI  input on possible antiplatelets/anticoagulant therapy - per discussion with Dr. San ok to restart anticoagulation.  Hopefully treated AVMs were source, but can't rule out small bowel AVMs.  Due to his  altered anatomy, this would require another sedating procedure with endoscopic placement of the video capsule. Additionally, I wouldn't be certain that the presence of small bowel AVMS even preclude restarting AP/AC, since recurrence is common even after successful endoscopic ablation.  GI planning outpatient follow up with Dr. Abran   AKI Creatinine peaked at 2.24 Creatinine 1.5 after lasix on day of discharge - watch renal function closely as outpatient on diuresis UA with 0-5 RBC, 300 mg/dl protein   Hypertension ?Hypertension emergency Toprol-XL, hydralazine, amlodipine   Atrial fibrillation, new onset Metoprolol Amiodarone per cards Xarelto prescribed at discharge per discussion with cards, watch for si/sx bleeding   Insulin-dependent diabetes mellitus type 2, uncontrolled due to hyperglycemia Resume home regimen   Hyperlipidemia -simvastatin   Prolonged QTc -Resolved   Celiac disease - Noted   COPD -As needed bronchodilators - no si/sx exacerbation   Chronic Pain - family notes some concern he may be taking extra pain meds from wife - PDMP reviewed, he's prescribed enough for q6hr prn dosing (he told me q4 prn).  Today he notes only taking 4x/day (but taking every 4 hrs as needed).  Emphasized take only as prescribed.     Procedures: Echo IMPRESSIONS     1. Left ventricular ejection fraction, by estimation, is 55 to 60%. The  left ventricle has normal function. The left ventricle has no regional  wall motion abnormalities. Left ventricular diastolic parameters are  consistent with Grade I diastolic  dysfunction (impaired relaxation).   2. Right ventricular systolic function is normal. The right ventricular  size is normal.   3. Left atrial size was moderately dilated.   4. Right atrial size was mildly dilated.   5. Servando Kyllonen small pericardial effusion is present. There is no evidence of  cardiac tamponade.   6. The mitral valve is normal in structure. Mild  mitral valve  regurgitation. No evidence of mitral stenosis.   7. The aortic valve is tricuspid. Aortic valve regurgitation is not  visualized. No aortic stenosis is present.   8. The inferior vena cava is dilated in size with <50% respiratory  variability, suggesting right atrial pressure of 15 mmHg.    LE US  MPRESSION: No evidence of bilateral lower extremity deep venous thrombosis.  EGD - Esophagogastric landmarks identified. - 2 cm hiatal hernia. - Z- line irregular. Possible [place] segment of BE - biopsies NOT taken to not confound the presentation in case capsule endoscopy is needed - Normal stomach. - Duodenal mucosal changes seen, suspicious for celiac disease. - Markedly angulated duodenal sweep making for difficult visualization there - multiple passes made - no concerning pathology.  No cause for anemia / bleeding noted on this exam. Recommend colonoscopy as next step if the patient is willing. Will discuss with the patient' s primary team. Recommendation: - Return patient to hospital ward for ongoing care. - Clear liquid diet today with bowel prep tonight, if patient is willing, for colonoscopy tomorrow. - Continue present medications. - Trend Hgb, monitor for bleeding - Dr. Rollin to assume inpatient GI care tomorrow  Colonoscopy - Five 2 to 3 mm polyps in the transverse colon, removed with Oscar Moses cold snare. Resected and retrieved. - Two non- bleeding colonic angiodysplastic lesions. Treated with Eara Burruel monopolar probe. - Diverticulosis in the sigmoid colon and in the  descending colon. Recommendation: - Patient has Callum Wolf contact number available for emergencies. The signs and symptoms of potential delayed complications were discussed with the patient. Return to normal activities tomorrow. Written discharge instructions were provided to the patient. - Resume previous diet. - Repeat colonoscopy in 3 years for surveillance, if clinically appropriate. - Follow  HGB.  Consultations: Cardiology GI  Discharge Exam: Vitals:   07/25/24 1306 07/25/24 1425  BP: (!) 123/57 (!) 100/52  Pulse:  70  Resp:  18  Temp:  97.7 F (36.5 C)  SpO2:  96%   Daughter at bedside Discussed discharge plan - he feels better after lasix yesterday  General: No acute distress. Cardiovascular: RRR Lungs: Clear to auscultation bilaterally  Neurological: Alert and oriented 3. Moves all extremities 4 with equal strength. Cranial nerves II through XII grossly intact. Extremities: No clubbing or cyanosis. No edema.  Discharge Instructions   Discharge Instructions     Call MD for:  difficulty breathing, headache or visual disturbances   Complete by: As directed    Call MD for:  extreme fatigue   Complete by: As directed    Call MD for:  hives   Complete by: As directed    Call MD for:  persistant dizziness or light-headedness   Complete by: As directed    Call MD for:  persistant nausea and vomiting   Complete by: As directed    Call MD for:  redness, tenderness, or signs of infection (pain, swelling, redness, odor or green/yellow discharge around incision site)   Complete by: As directed    Call MD for:  severe uncontrolled pain   Complete by: As directed    Call MD for:  temperature >100.4   Complete by: As directed    Diet - low sodium heart healthy   Complete by: As directed    Discharge instructions   Complete by: As directed    You were seen for chest pain.  There was Torie Priebe plan for Oyindamola Key catheterization, but this has been deferred to the outpatient setting due to concerns for anemia and bleeding as well as decreased kidney function.  You've had an upper endoscopy with gastroenterology.  This showed findings concerning for barrett's esophagus which should be followed with gastroenterology outpatient.  You had Hannelore Bova colonoscopy with nonbleeding AVMs.  These were treated by gastroenterology.  You also had polyps removed, biopsies are pending.    We discussed  the plan of care with cardiology and gastroenterology and have decided to restart anticoagulation given your atrial fibrillation and the risk of stroke.  Watch for bleeding, dark or tarry stools.  Avoid activities that could put you at risk for injury/bleeding/trauma.  Repeat blood counts with your PCP or cardiologist within Sanyia Dini week or so.  You had fluid between the lung and your chest wall and shortness of breath that improved with lasix.  We'll send you home with 20 mg lasix daily.  Follow repeat labs with your PCP or cardiologist within Hyrum Shaneyfelt week.  Your kidney function is not at baseline and needs to be followed as an outpatient.  Use your norco only as prescribed.  It's important to not take anymore than is prescribed.  Return for new, recurrent, or worsening symptoms.  Please ask your PCP to request records from this hospitalization so they know what was done and what the next steps will be.   Increase activity slowly   Complete by: As directed       Allergies as of 07/25/2024  No Known Allergies      Medication List     STOP taking these medications    aspirin EC 81 MG tablet   fosinopril 40 MG tablet Commonly known as: MONOPRIL       TAKE these medications    amiodarone 200 MG tablet Commonly known as: PACERONE Take 1 tablet (200 mg total) by mouth 2 (two) times daily for 10 days, THEN 1 tablet (200 mg total) daily. Follow with cardiology outpatient for refills. Start taking on: July 25, 2024   amLODipine 10 MG tablet Commonly known as: NORVASC Take 1 tablet (10 mg total) by mouth daily. Follow with PCP or cards for refills Start taking on: July 26, 2024   FreeStyle Libre 3 Sensor Misc 1 each by Other route every 14 (fourteen) days.   furosemide 20 MG tablet Commonly known as: Lasix Take 1 tablet (20 mg total) by mouth daily. Follow with your PCP and/or cardiology for repeat labs within Ashliegh Parekh week.   hydrALAZINE 100 MG tablet Commonly known as:  APRESOLINE Take 1 tablet (100 mg total) by mouth every 8 (eight) hours. Follow with PCP or cards for refills   HYDROcodone-acetaminophen 10-325 MG tablet Commonly known as: NORCO Take 1 tablet by mouth 4 (four) times daily as needed.   insulin lispro 100 UNIT/ML KwikPen Commonly known as: HUMALOG See admin instructions. Use per sliding scale three to four times daily as directed.   metoprolol succinate 50 MG 24 hr tablet Commonly known as: TOPROL-XL Take 1 tablet (50 mg total) by mouth daily. Take with or immediately following Shlok Raz meal. Start taking on: July 26, 2024   potassium chloride 10 MEQ tablet Commonly known as: Klor-Con 10 Take 1 tablet (10 mEq total) by mouth daily. Follow with PCP or cards for repeat labs within Marijah Larranaga week   Rivaroxaban 15 MG Tabs tablet Commonly known as: XARELTO Take 1 tablet (15 mg total) by mouth daily with supper. Follow with cardiology or PCP for refills.   simvastatin 20 MG tablet Commonly known as: ZOCOR Take 20 mg by mouth every morning.   traZODone 50 MG tablet Commonly known as: DESYREL Take 75 mg by mouth at bedtime as needed.   Tresiba FlexTouch 100 UNIT/ML FlexTouch Pen Generic drug: insulin degludec Inject 12 Units into the skin at bedtime.       No Known Allergies    The results of significant diagnostics from this hospitalization (including imaging, microbiology, ancillary and laboratory) are listed below for reference.    Significant Diagnostic Studies: DG Chest 2 View Result Date: 07/23/2024 CLINICAL DATA:  Shortness of breath.  Pleural effusions. EXAM: DG CHEST 2V COMPARISON:  Radiographs and CT 07/17/2024. FINDINGS: The heart size and mediastinal contours are stable. Persistent bilateral pleural effusions appear slightly larger based on the lateral view. The overall pulmonary aeration has improved, and there is no edema, confluent airspace disease or pneumothorax. Postsurgical changes from previous lower cervical fusion  and mild thoracic spine degenerative changes are noted. IMPRESSION: Slightly increased bilateral pleural effusions. No evidence of pneumonia or edema. Electronically Signed   By: Elsie Perone M.D.   On: 07/23/2024 11:27   ECHOCARDIOGRAM COMPLETE Result Date: 07/19/2024    ECHOCARDIOGRAM REPORT   Patient Name:   Oscar Moses Claudio Date of Exam: 07/19/2024 Medical Rec #:  [id number]    Height:       69.0 in Accession #:    [id number]   Weight:       157.5 lb Date of  Birth:  17-Dec-1950    BSA:          1.867 m Patient Age:    73 years     BP:           126/60 mmHg Patient Gender: M            HR:           70 bpm. Exam Location:  Inpatient Procedure: 2D Echo, Color Doppler and Cardiac Doppler (Both Spectral and Color            Flow Doppler were utilized during procedure). Indications:    NSTEMI i21.4  History:        Patient has no prior history of Echocardiogram examinations.                 COPD; Risk Factors:Hypertension, Diabetes and Dyslipidemia.  Sonographer:    Merlynn Argyle Referring Phys: [id number] LOGAN N LOCKWOOD IMPRESSIONS  1. Left ventricular ejection fraction, by estimation, is 55 to 60%. The left ventricle has normal function. The left ventricle has no regional wall motion abnormalities. Left ventricular diastolic parameters are consistent with Grade I diastolic dysfunction (impaired relaxation).  2. Right ventricular systolic function is normal. The right ventricular size is normal.  3. Left atrial size was moderately dilated.  4. Right atrial size was mildly dilated.  5. Collins Dimaria small pericardial effusion is present. There is no evidence of cardiac tamponade.  6. The mitral valve is normal in structure. Mild mitral valve regurgitation. No evidence of mitral stenosis.  7. The aortic valve is tricuspid. Aortic valve regurgitation is not visualized. No aortic stenosis is present.  8. The inferior vena cava is dilated in size with <50% respiratory variability, suggesting right atrial pressure of 15 mmHg. FINDINGS   Left Ventricle: Left ventricular ejection fraction, by estimation, is 55 to 60%. The left ventricle has normal function. The left ventricle has no regional wall motion abnormalities. The left ventricular internal cavity size was normal in size. There is  no left ventricular hypertrophy. Left ventricular diastolic parameters are consistent with Grade I diastolic dysfunction (impaired relaxation). Right Ventricle: The right ventricular size is normal. No increase in right ventricular wall thickness. Right ventricular systolic function is normal. Left Atrium: Left atrial size was moderately dilated. Right Atrium: Right atrial size was mildly dilated. Pericardium: Meredith Mells small pericardial effusion is present. There is no evidence of cardiac tamponade. Mitral Valve: The mitral valve is normal in structure. Mild mitral valve regurgitation. No evidence of mitral valve stenosis. Tricuspid Valve: The tricuspid valve is normal in structure. Tricuspid valve regurgitation is not demonstrated. No evidence of tricuspid stenosis. Aortic Valve: The aortic valve is tricuspid. Aortic valve regurgitation is not visualized. No aortic stenosis is present. Pulmonic Valve: The pulmonic valve was normal in structure. Pulmonic valve regurgitation is not visualized. No evidence of pulmonic stenosis. Aorta: The aortic root is normal in size and structure. Venous: The inferior vena cava is dilated in size with less than 50% respiratory variability, suggesting right atrial pressure of 15 mmHg. IAS/Shunts: No atrial level shunt detected by color flow Doppler.  LEFT VENTRICLE PLAX 2D LVIDd:         4.90 cm   Diastology LVIDs:         3.60 cm   LV e' medial:    5.84 cm/s LV PW:         1.10 cm   LV E/e' medial:  13.9 LV IVS:  1.20 cm   LV e' lateral:   7.22 cm/s LVOT diam:     2.30 cm   LV E/e' lateral: 11.2 LV SV:         79 LV SV Index:   42 LVOT Area:     4.15 cm LV IVRT:       130 msec  RIGHT VENTRICLE             IVC RV Basal diam:  3.95 cm      IVC diam: 2.40 cm RV S prime:     12.90 cm/s TAPSE (M-mode): 2.4 cm LEFT ATRIUM             Index        RIGHT ATRIUM           Index LA diam:        4.50 cm 2.41 cm/m   RA Area:     22.20 cm LA Vol (A2C):   86.2 ml 46.18 ml/m  RA Volume:   69.50 ml  37.23 ml/m LA Vol (A4C):   80.7 ml 43.23 ml/m LA Biplane Vol: 87.6 ml 46.93 ml/m  AORTIC VALVE LVOT Vmax:   91.60 cm/s LVOT Vmean:  61.400 cm/s LVOT VTI:    0.189 m  AORTA Ao Root diam: 3.50 cm MITRAL VALVE MV Area (PHT): 2.39 cm    SHUNTS MV Decel Time: 318 msec    Systemic VTI:  0.19 m MV E velocity: 80.90 cm/s  Systemic Diam: 2.30 cm MV Gavyn Zoss velocity: 74.70 cm/s MV E/Charlissa Petros ratio:  1.08 Morene Brownie Electronically signed by Morene Brownie Signature Date/Time: 07/19/2024/12:35:01 PM    Final    US  Venous Img Lower Bilateral Result Date: 07/17/2024 CLINICAL DATA:  73 year old male with lower extremity swelling. EXAM: BILATERAL LOWER EXTREMITY VENOUS DOPPLER ULTRASOUND TECHNIQUE: Gray-scale sonography with graded compression, as well as color Doppler and duplex ultrasound were performed to evaluate the lower extremity deep venous systems from the level of the common femoral vein and including the common femoral, femoral, profunda femoral, popliteal and calf veins including the posterior tibial, peroneal and gastrocnemius veins when visible. The superficial great saphenous vein was also interrogated. Spectral Doppler was utilized to evaluate flow at rest and with distal augmentation maneuvers in the common femoral, femoral and popliteal veins. COMPARISON:  None Available. FINDINGS: RIGHT LOWER EXTREMITY Common Femoral Vein: No evidence of thrombus. Normal compressibility, respiratory phasicity and response to augmentation. Saphenofemoral Junction: No evidence of thrombus. Normal compressibility and flow on color Doppler imaging. Profunda Femoral Vein: No evidence of thrombus. Normal compressibility and flow on color Doppler imaging. Femoral Vein: No evidence  of thrombus. Normal compressibility, respiratory phasicity and response to augmentation. Popliteal Vein: No evidence of thrombus. Normal compressibility, respiratory phasicity and response to augmentation. Calf Veins: No evidence of thrombus. Normal compressibility and flow on color Doppler imaging. Other Findings:  None. LEFT LOWER EXTREMITY Common Femoral Vein: No evidence of thrombus. Normal compressibility, respiratory phasicity and response to augmentation. Saphenofemoral Junction: No evidence of thrombus. Normal compressibility and flow on color Doppler imaging. Profunda Femoral Vein: No evidence of thrombus. Normal compressibility and flow on color Doppler imaging. Femoral Vein: No evidence of thrombus. Normal compressibility, respiratory phasicity and response to augmentation. Popliteal Vein: No evidence of thrombus. Normal compressibility, respiratory phasicity and response to augmentation. Calf Veins: No evidence of thrombus. Normal compressibility and flow on color Doppler imaging. Other Findings:  None. IMPRESSION: No evidence of bilateral lower extremity deep venous thrombosis. Ester Sides, MD  [facility] Radiology Electronically Signed   By: Ester Sides M.D.   On: 07/17/2024 16:08   CT Angio Chest PE W and/or Wo Contrast Result Date: 07/17/2024 CLINICAL DATA:  Pulmonary embolism suspected. Difficulty breathing, chest pain. EXAM: CT ANGIOGRAPHY CHEST WITH CONTRAST TECHNIQUE: Multidetector CT imaging of the chest was performed using the standard protocol during bolus administration of intravenous contrast. Multiplanar CT image reconstructions and MIPs were obtained to evaluate the vascular anatomy. RADIATION DOSE REDUCTION: This exam was performed according to the departmental dose-optimization program which includes automated exposure control, adjustment of the mA and/or kV according to patient size and/or use of iterative reconstruction technique.  CONTRAST:  80mL OMNIPAQUE IOHEXOL 350 MG/ML SOLN COMPARISON:  None Available. FINDINGS: Cardiovascular: Negative for pulmonary embolus. Atherosclerotic calcification of the aorta, aortic valve and coronary arteries. Heart is enlarged. Small pericardial effusion. Mediastinum/Nodes: Thoracic inlet lymph nodes are not enlarged by CT size criteria. No pathologically enlarged mediastinal, hilar or axillary lymph nodes. Esophagus is grossly unremarkable. Lungs/Pleura: Image quality is degraded by expiratory phase imaging, creating added density in the lungs. Dependent atelectasis. Moderate bilateral pleural effusions. Calcified granulomas. Airway is unremarkable. Upper Abdomen: Gallstone. Low-attenuation lesion in the right kidney, incompletely visualized. No specific follow-up necessary. Probable renal vascular calcification on the right. Visualized portions of the liver, gallbladder, adrenal glands, kidneys, spleen, pancreas, stomach and bowel are otherwise grossly unremarkable. No upper abdominal adenopathy. Musculoskeletal: Degenerative changes in the spine.  Kyphosis. Review of the MIP images confirms the above findings. IMPRESSION: 1. Negative for pulmonary embolus. 2. Moderate bilateral pleural effusions. 3. Small pericardial effusion. 4. Cholelithiasis. 5. Aortic atherosclerosis (ICD10-I70.0). Coronary artery calcification. Electronically Signed   By: Newell Eke M.D.   On: 07/17/2024 13:46   DG Chest 2 View Result Date: 07/17/2024 EXAM: 2 VIEW(S) XRAY OF THE CHEST 07/17/2024 11:37:00 AM COMPARISON: None available. CLINICAL HISTORY: Shoaff of breath FINDINGS: LUNGS AND PLEURA: Right basilar opacities. Blunting of bilateral costophrenic angles, right greater than left. No pulmonary edema. No pneumothorax. HEART AND MEDIASTINUM: No acute abnormality of the cardiac and mediastinal silhouettes. BONES AND SOFT TISSUES: No acute osseous abnormality. IMPRESSION: 1. Right basilar opacities. 2. Blunting of bilateral  costophrenic angles, right greater than left. Electronically signed by: Norleen Boxer MD 07/17/2024 12:21 PM EST RP Workstation: [id number]    Microbiology: No results found for this or any previous visit (from the past 240 hours).   Labs: Basic Metabolic Panel: Recent Labs  Lab 07/20/24 0351 07/21/24 0308 07/22/24 0401 07/23/24 0503 07/24/24 0527 07/25/24 0515  NA 137 139 145 138 136 138  K 3.7 4.6 4.0 3.6 4.2 3.8  CL 103 105 104 104 103 100  CO2 25 25 25 24 23 23   GLUCOSE 91 104* 99 145* 134* 86  BUN 26* 18 10 14 14 17   CREATININE 1.97* 1.53* 1.30* 1.37* 1.16 1.51*  CALCIUM 7.9* 8.2* 8.9 8.1* 8.5* 8.5*  MG 2.2  --   --   --   --  1.8  PHOS 4.9*  --   --   --   --   --    Liver Function Tests: Recent Labs  Lab 07/19/24 0420 07/25/24 0515  AST 21 23  ALT 21 20  ALKPHOS 60 50  BILITOT 0.7 0.6  PROT 5.3* 5.7*  ALBUMIN 2.4* 2.7*   No results for input(s): LIPASE, AMYLASE in the last 168 hours. No results for input(s): AMMONIA in the last 168 hours. CBC: Recent Labs  Lab 07/21/24 0308 07/21/24 1608 07/22/24 0401 07/22/24 1541 07/23/24 0503 07/24/24 0527 07/25/24 0515  WBC 5.0  --  10.0  --  8.6 8.0 6.4  HGB 7.9*   < > 9.6* 9.6* 9.6* 10.7* 9.7*  HCT 23.7*   < > 28.8* 28.8* 28.7* 32.2* 29.4*  MCV 96.7  --  96.0  --  97.0 97.9 97.7  PLT 243  --  305  --  266 349 297   < > = values in this interval not displayed.   Cardiac Enzymes: No results for input(s): CKTOTAL, CKMB, CKMBINDEX, TROPONINI in the last 168 hours. BNP: BNP (last 3 results) Recent Labs    07/23/24 1626  BNP 342.9*    ProBNP (last 3 results) Recent Labs    07/17/24 1129  PROBNP 2,857.0*    CBG: Recent Labs  Lab 07/24/24 2004 07/24/24 2346 07/25/24 0353 07/25/24 0739 07/25/24 1145  GLUCAP 187* 248* 88 144* 239*       Signed:  Meliton Monte MD.  [facility] 07/25/2024, 2:38 PM

## 2024-07-31 ENCOUNTER — Encounter: Payer: Self-pay | Admitting: Gastroenterology

## 2024-08-04 NOTE — Progress Notes (Unsigned)
 Cardiology Office Note   Date:  08/07/2024  ID:  Oscar Moses, DOB 1951-03-04, MRN [id number] PCP: Onita Norleen, MD   HeartCare Providers Cardiologist:  Lonni Cash, MD   History of Present Illness Oscar Moses is a 73 y.o. 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  Was last seen 07/18/2024 in consultation with no prior history of cardiac issues.  Presented to the ED with fatigue and progressive shortness of breath and several episodes of chest pain.  Blood pressure was very high at 198/128, heart rate 101.  No DVT on ultrasound.  CTA negative for pulmonary embolism, moderate bilateral pleural effusions, small pericardial effusion, aortic and coronary calcifications.  Cholelithiasis.  Troponin 31 down to 26 when trended.  Hemoglobin A1c 9.6%.  Hemoglobin 10.6 unremarkable BMP.  proBNP 2857.  Recommended starting IV heparin , IV Lasix , and IV nitroglycerin  for blood pressure and chest pain.  Troponins to be trended.  Patient went into A-fib with RVR with symptoms of shortness of breath.  Given IV and oral Lopressor  and converted back to normal sinus rhythm.  On interview was having dyspnea on exertion which was progressively worsening.  Then about 6 months ago started noticing chest pain with exertion.  Chest pain was described as heaviness on the left side and once he reached a 4 out of 10 the patient sits down and that eventually goes away with rest.  Did report 2 months ago started to notice orthopnea and PND.  Did have some bilateral lower extremity edema which was worse on the left that started about 2 weeks ago.  Also endorsed lightheadedness and dizziness with positional changes.  He states that he had a CCTA back with Dr. Onita couple months ago and was told it was  normal.  Unable to review the chart given out of system.  Initially planned for inpatient cardiac catheterization however creatinine spiked from 1.4-2.2 and cath was deferred for this reason.  Outpatient workup was recommended  Today, he presents with atrial fibrillation who has shortness of breath and chest pressure.  He has had shortness of breath and a sensation of chest pressure, described as feeling like a small child sitting on him, for about two weeks. He notes low energy and persistent leg swelling. He feels lightheaded and dizzy with position changes but has no significant chest pain.  Prior coronary CT showed no coronary calcium . Echocardiogram showed preserved left ventricular systolic function at 55-60% with mild diastolic dysfunction, moderately enlarged atria, mild mitral regurgitation, and a small pericardial effusion.  He was previously hospitalized when a planned cardiac catheterization was deferred due to acute kidney injury with creatinine peaking at 2.2, now improved to 1.5. He received Lasix  with improvement in fluid retention and renal function.  Current medications include amiodarone , hydralazine , amlodipine , Lasix , metoprolol , and an anticoagulant. He reports new ankle swelling since starting amlodipine .  He can complete daily tasks such as mowing the lawn but develops dyspnea and fatigue afterward and often needs to stop and rest in a chair. He has a family history of heart disease.   No orthopnea, PND. Reports no palpitations.   Discussed the use of AI scribe software for clinical note transcription with the patient, who gave verbal consent to proceed.   ROS: Pertinent ROS in HPI  Studies Reviewed     Echocardiogram 07/19/2024 IMPRESSIONS  1. Left ventricular ejection fraction, by estimation, is 55 to 60%. The  left ventricle has normal function. The left ventricle has no regional  wall motion abnormalities. Left ventricular diastolic parameters are   consistent with Grade I diastolic  dysfunction (impaired relaxation).   2. Right ventricular systolic function is normal. The right ventricular  size is normal.   3. Left atrial size was moderately dilated.   4. Right atrial size was mildly dilated.   5. A small pericardial effusion is present. There is no evidence of  cardiac tamponade.   6. The mitral valve is normal in structure. Mild mitral valve  regurgitation. No evidence of mitral stenosis.   7. The aortic valve is tricuspid. Aortic valve regurgitation is not  visualized. No aortic stenosis is present.   8. The inferior vena cava is dilated in size with <50% respiratory  variability, suggesting right atrial pressure of 15 mmHg.    Risk Assessment/Calculations  CHA2DS2-VASc Score = 4  This indicates a 4.8% annual risk of stroke. The patient's score is based upon: CHF History: 0 HTN History: 1 Diabetes History: 1 Stroke History: 0 Vascular Disease History: 1 Age Score: 1 Gender Score: 0       Physical Exam VS:  BP (!) 120/50   Pulse 63   Ht 5' 9.5 (1.765 m)   Wt 162 lb (73.5 kg)   SpO2 98%   BMI 23.58 kg/m        Wt Readings from Last 3 Encounters:  08/07/24 162 lb (73.5 kg)  07/24/24 151 lb 6.4 oz (68.7 kg)  01/01/23 165 lb (74.8 kg)    GEN: Well nourished, well developed in no acute distress NECK: No JVD; No carotid bruits CARDIAC: RRR, no murmurs, rubs, gallops RESPIRATORY:  Clear to auscultation without rales, wheezing or rhonchi  ABDOMEN: Soft, non-tender, non-distended EXTREMITIES:  No edema; No deformity   ASSESSMENT AND PLAN  Paroxysmal atrial fibrillation with acute on chronic combined systolic and diastolic heart failure Currently in normal sinus rhythm. Echocardiogram shows moderately enlarged atria and slightly impaired relaxation. Mild mitral valve leak and small pericardial effusion noted. Symptoms may be related to atrial fibrillation or angina. - Continue amiodarone , hydralazine ,  Norvasc , amlodipine , Lasix , metoprolol , and xarelto  - Scheduled cardiac catheterization to evaluate for coronary artery disease. - Ordered labs to monitor kidney function before catheterization. - Provided guidance on blood thinner management before procedure.  Unstable angina Intermittent chest pressure suggestive of angina. Previous CT scan showed no coronary calcium buildup. Discussed risks of cardiac catheterization including rare risks of death, heart attack, and stroke. - Scheduled cardiac catheterization to evaluate for coronary artery disease. - Discussed potential for stenting during catheterization if significant blockages are found.  Acute kidney injury Previous creatinine levels elevated but improving with Lasix . Last creatinine was 1.5, down from 2.2. - Ordered labs to monitor kidney function before cardiac catheterization. - Will provide IV hydration if needed before and after catheterization.  Essential hypertension Blood pressure managed with hydralazine , Norvasc , and amlodipine . Amlodipine  may be contributing to ankle swelling. - Monitor blood pressure and adjust medications as needed post-catheterization.  Hyperlipidemia LDL cholesterol is well-controlled at 31. - Continue current lipid management regimen.  The patient understands that risks include but are not limited to stroke (1 in 1000), death (1 in 1000), kidney failure [usually temporary] (1 in 500), bleeding (1 in 200), allergic reaction [possibly serious] (1 in 200), and agrees to proceed.       Dispo: He can follow-up  postcardiac catheterization  Signed, Orren LOISE Fabry, PA-C

## 2024-08-07 ENCOUNTER — Encounter: Payer: Self-pay | Admitting: Physician Assistant

## 2024-08-07 ENCOUNTER — Ambulatory Visit: Attending: Physician Assistant | Admitting: Physician Assistant

## 2024-08-07 VITALS — BP 120/50 | HR 63 | Ht 69.5 in | Wt 162.0 lb

## 2024-08-07 DIAGNOSIS — E785 Hyperlipidemia, unspecified: Secondary | ICD-10-CM

## 2024-08-07 DIAGNOSIS — N179 Acute kidney failure, unspecified: Secondary | ICD-10-CM | POA: Diagnosis not present

## 2024-08-07 DIAGNOSIS — I10 Essential (primary) hypertension: Secondary | ICD-10-CM | POA: Diagnosis not present

## 2024-08-07 DIAGNOSIS — I5043 Acute on chronic combined systolic (congestive) and diastolic (congestive) heart failure: Secondary | ICD-10-CM | POA: Diagnosis not present

## 2024-08-07 DIAGNOSIS — I200 Unstable angina: Secondary | ICD-10-CM

## 2024-08-07 DIAGNOSIS — I480 Paroxysmal atrial fibrillation: Secondary | ICD-10-CM

## 2024-08-07 DIAGNOSIS — D62 Acute posthemorrhagic anemia: Secondary | ICD-10-CM

## 2024-08-07 MED ORDER — NITROGLYCERIN 0.4 MG SL SUBL: 0.4000 mg | SUBLINGUAL_TABLET | SUBLINGUAL | 3 refills | Status: DC | PRN

## 2024-08-07 NOTE — Patient Instructions (Addendum)
 Medication Instructions:  Your physician recommends that you continue on your current medications as directed. Please refer to the Current Medication list given to you today. *If you need a refill on your cardiac medications before your next appointment, please call your pharmacy*  Lab Work: TODAY-BMET & CBC If you have labs (blood work) drawn today and your tests are completely normal, you will receive your results only by: MyChart Message (if you have MyChart) OR A paper copy in the mail If you have any lab test that is abnormal or we need to change your treatment, we will call you to review the results.  Testing/Procedures: Your physician has requested that you have a cardiac catheterization. Cardiac catheterization is used to diagnose and/or treat various heart conditions. Doctors may recommend this procedure for a number of different reasons. The most common reason is to evaluate chest pain. Chest pain can be a symptom of coronary artery disease (CAD), and cardiac catheterization can show whether plaque is narrowing or blocking your heart's arteries. This procedure is also used to evaluate the valves, as well as measure the blood flow and oxygen levels in different parts of your heart. For further information please visit [email or web address] Please follow instruction sheet, as given.  Follow-Up: At [facility], you and your health needs are our priority.  As part of our continuing mission to provide you with exceptional heart care, our providers are all part of one team.  This team includes your primary Cardiologist (physician) and Advanced Practice Providers or APPs (Physician Assistants and Nurse Practitioners) who all work together to provide you with the care you need, when you need it.  Your next appointment:   1-2 week(s)  Provider:   Orren Fabry, PA-C OR ANY APP  We recommend signing up for the patient portal called MyChart.  Sign up information is provided on this  After Visit Summary.  MyChart is used to connect with patients for Virtual Visits (Telemedicine).  Patients are able to view lab/test results, encounter notes, upcoming appointments, etc.  Non-urgent messages can be sent to your provider as well.   To learn more about what you can do with MyChart, go to [email or web address].   Other Instructions           Cardiac Catheterization   You are scheduled for a Cardiac Catheterization on Friday, December 5 with Dr. Newman Lawrence.  1. Please arrive at the [facility] (Main Entrance A) at [facility]: [address] [place], [place] [address] at 7:00 AM (This time is 2 hour(s) before your procedure to ensure your preparation).   Free valet parking service is available. You will check in at ADMITTING. The support person will be asked to wait in the waiting room.  It is OK to have someone drop you off and come back when you are ready to be discharged.        Special note: Every effort is made to have your procedure done on time. Please understand that emergencies sometimes delay scheduled procedures.  2. Diet: Nothing to eat after midnight.  3. Hydration:You need to be well hydrated before your procedure. On December 5, you may drink approved liquids (see below) until 2 hours before the procedure, with 16 oz of water  as your last intake.   List of approved liquids water , clear juice, clear tea, black coffee, fruit juices, non-citric and without pulp, carbonated beverages, Gatorade, Kool -Aid, plain Jello-O and plain ice popsicles.  4. Labs: You will need to  have blood drawn on Monday, December 1 at [facility] (1st Floor), [address] [place], [place] [address]. You do not need to be fasting.  5. Medication instructions in preparation for your procedure:   Contrast Allergy: No  HOLD Lasix  (furosemide ) on the morning of your cath.  Take only 6 units of insulin  (tresiba) the  night before your procedure. Do not take any insulin  on the day of the procedure.  Take half dose of Humalog the night before your cath.  You do not need to remove your Glucose sensor, however just inform the nurse at [facility] stay that you have it on.  On the morning of your procedure, take Aspirin  81 mg and any morning medicines NOT listed above.  You may use sips of water .  6. Plan to go home the same day, you will only stay overnight if medically necessary.  7. You MUST have a responsible adult to drive you home.  8. An adult MUST be with you the first 24 hours after you arrive home.  9. Bring a current list of your medications, and the last time and date medication taken.  10. Bring ID and current insurance cards.  11.Please wear clothes that are easy to get on and off and wear slip-on shoes.  Thank you for allowing us  to care for you!   -- [facility]

## 2024-08-08 LAB — BASIC METABOLIC PANEL WITH GFR
BUN/Creatinine Ratio: 20 (ref 10–24)
BUN: 31 mg/dL — AB (ref 8–27)
CO2: 19 mmol/L — AB (ref 20–29)
Calcium: 8.9 mg/dL (ref 8.6–10.2)
Chloride: 104 mmol/L (ref 96–106)
Creatinine, Ser: 1.55 mg/dL — AB (ref 0.76–1.27)
Glucose: 90 mg/dL (ref 70–99)
Potassium: 5.2 mmol/L (ref 3.5–5.2)
Sodium: 139 mmol/L (ref 134–144)
eGFR: 47 mL/min/1.73 — AB (ref >59)

## 2024-08-08 LAB — CBC
Hematocrit: 28.6 % — ABNORMAL LOW (ref 37.5–51.0)
Hemoglobin: 9.7 g/dL — ABNORMAL LOW (ref 13.0–17.7)
MCH: 32.9 pg (ref 26.6–33.0)
MCHC: 33.9 g/dL (ref 31.5–35.7)
MCV: 97 fL (ref 79–97)
Platelets: 313 x10E3/uL (ref 150–450)
RBC: 2.95 x10E6/uL — ABNORMAL LOW (ref 4.14–5.80)
RDW: 12.6 % (ref 11.6–15.4)
WBC: 7.7 x10E3/uL (ref 3.4–10.8)

## 2024-08-09 ENCOUNTER — Ambulatory Visit: Payer: Self-pay | Admitting: Physician Assistant

## 2024-08-10 ENCOUNTER — Telehealth: Payer: Self-pay | Admitting: *Deleted

## 2024-08-10 NOTE — Telephone Encounter (Signed)
 Cardiac Catheterization scheduled at [facility] for: Friday August 11, 2024 9 AM Arrival time [facility] Main Entrance A at: 7 AM  Diet: -Nothing to eat after midnight.  Hydration: -May drink clear liquids until 2 hours before the procedure.  Approved liquids: Water , clear tea, black coffee, fruit juices-non-citric and without pulp,Gatorade, plain Jello/popsicles.   -Please drink 16 oz of water  2 hours before procedure.  Medication instructions: -Hold:  Xarelto -none 08/09/24 until post procedure  Lasix /KCl-day before and day of procedure -per protocol <60 (47)-pt already taken this morning  Insulin -AM of procedure/1/2 usual dose of Tresiba HS prior to procedure  -Other usual morning medications can be taken including aspirin  81 mg.  Plan to go home the same day, you will only stay overnight if medically necessary.  You must have responsible adult to drive you home.  Someone must be with you the first 24 hours after you arrive home.  Reviewed procedure instructions with patient.

## 2024-08-11 ENCOUNTER — Other Ambulatory Visit (HOSPITAL_COMMUNITY): Payer: Self-pay

## 2024-08-11 ENCOUNTER — Ambulatory Visit (HOSPITAL_COMMUNITY): Admission: RE | Disposition: A | Payer: Self-pay | Attending: Cardiology

## 2024-08-11 ENCOUNTER — Ambulatory Visit (HOSPITAL_COMMUNITY)
Admission: RE | Admit: 2024-08-11 | Discharge: 2024-08-11 | Disposition: A | Attending: Cardiology | Admitting: Cardiology

## 2024-08-11 ENCOUNTER — Other Ambulatory Visit: Payer: Self-pay

## 2024-08-11 DIAGNOSIS — I25118 Atherosclerotic heart disease of native coronary artery with other forms of angina pectoris: Secondary | ICD-10-CM | POA: Diagnosis not present

## 2024-08-11 DIAGNOSIS — Z79899 Other long term (current) drug therapy: Secondary | ICD-10-CM | POA: Insufficient documentation

## 2024-08-11 DIAGNOSIS — D631 Anemia in chronic kidney disease: Secondary | ICD-10-CM | POA: Insufficient documentation

## 2024-08-11 DIAGNOSIS — I2510 Atherosclerotic heart disease of native coronary artery without angina pectoris: Secondary | ICD-10-CM | POA: Insufficient documentation

## 2024-08-11 DIAGNOSIS — N183 Chronic kidney disease, stage 3 unspecified: Secondary | ICD-10-CM | POA: Insufficient documentation

## 2024-08-11 DIAGNOSIS — E785 Hyperlipidemia, unspecified: Secondary | ICD-10-CM | POA: Insufficient documentation

## 2024-08-11 DIAGNOSIS — E1039 Type 1 diabetes mellitus with other diabetic ophthalmic complication: Secondary | ICD-10-CM | POA: Diagnosis present

## 2024-08-11 DIAGNOSIS — Z7901 Long term (current) use of anticoagulants: Secondary | ICD-10-CM | POA: Insufficient documentation

## 2024-08-11 DIAGNOSIS — I5043 Acute on chronic combined systolic (congestive) and diastolic (congestive) heart failure: Secondary | ICD-10-CM | POA: Insufficient documentation

## 2024-08-11 DIAGNOSIS — I130 Hypertensive heart and chronic kidney disease with heart failure and stage 1 through stage 4 chronic kidney disease, or unspecified chronic kidney disease: Secondary | ICD-10-CM | POA: Insufficient documentation

## 2024-08-11 DIAGNOSIS — Z955 Presence of coronary angioplasty implant and graft: Secondary | ICD-10-CM

## 2024-08-11 DIAGNOSIS — E1021 Type 1 diabetes mellitus with diabetic nephropathy: Secondary | ICD-10-CM | POA: Insufficient documentation

## 2024-08-11 DIAGNOSIS — E1022 Type 1 diabetes mellitus with diabetic chronic kidney disease: Secondary | ICD-10-CM | POA: Insufficient documentation

## 2024-08-11 DIAGNOSIS — I200 Unstable angina: Secondary | ICD-10-CM | POA: Diagnosis present

## 2024-08-11 DIAGNOSIS — D649 Anemia, unspecified: Secondary | ICD-10-CM | POA: Diagnosis present

## 2024-08-11 DIAGNOSIS — I480 Paroxysmal atrial fibrillation: Secondary | ICD-10-CM | POA: Insufficient documentation

## 2024-08-11 DIAGNOSIS — Z8249 Family history of ischemic heart disease and other diseases of the circulatory system: Secondary | ICD-10-CM | POA: Insufficient documentation

## 2024-08-11 DIAGNOSIS — Z7982 Long term (current) use of aspirin: Secondary | ICD-10-CM | POA: Insufficient documentation

## 2024-08-11 DIAGNOSIS — I10 Essential (primary) hypertension: Secondary | ICD-10-CM | POA: Diagnosis present

## 2024-08-11 DIAGNOSIS — Z87891 Personal history of nicotine dependence: Secondary | ICD-10-CM | POA: Insufficient documentation

## 2024-08-11 DIAGNOSIS — Z794 Long term (current) use of insulin: Secondary | ICD-10-CM | POA: Insufficient documentation

## 2024-08-11 DIAGNOSIS — E10319 Type 1 diabetes mellitus with unspecified diabetic retinopathy without macular edema: Secondary | ICD-10-CM | POA: Insufficient documentation

## 2024-08-11 DIAGNOSIS — N179 Acute kidney failure, unspecified: Secondary | ICD-10-CM | POA: Insufficient documentation

## 2024-08-11 HISTORY — PX: CORONARY STENT INTERVENTION: CATH118234

## 2024-08-11 HISTORY — PX: CORONARY PRESSURE/FFR STUDY: CATH118243

## 2024-08-11 HISTORY — PX: LEFT HEART CATH AND CORONARY ANGIOGRAPHY: CATH118249

## 2024-08-11 LAB — POCT ACTIVATED CLOTTING TIME
Activated Clotting Time: 245 s
Activated Clotting Time: 250 s
Activated Clotting Time: 296 s
Activated Clotting Time: 235 s

## 2024-08-11 LAB — GLUCOSE, CAPILLARY: Glucose-Capillary: 274 mg/dL — ABNORMAL HIGH (ref 70–99)

## 2024-08-11 SURGERY — LEFT HEART CATH AND CORONARY ANGIOGRAPHY
Anesthesia: LOCAL

## 2024-08-11 MED ORDER — INSULIN ASPART 100 UNIT/ML IJ SOLN
5.0000 [IU] | Freq: Once | INTRAMUSCULAR | Status: AC
  Administered 2024-08-11: 5 [IU] via SUBCUTANEOUS
  Filled 2024-08-11: qty 5

## 2024-08-11 MED ORDER — FENTANYL CITRATE (PF) 100 MCG/2ML IJ SOLN
INTRAMUSCULAR | Status: AC
  Filled 2024-08-11: qty 2

## 2024-08-11 MED ORDER — FENTANYL CITRATE (PF) 100 MCG/2ML IJ SOLN
INTRAMUSCULAR | Status: DC | PRN
  Administered 2024-08-11 (×2): 25 ug via INTRAVENOUS

## 2024-08-11 MED ORDER — ASPIRIN 81 MG PO CHEW: 81.0000 mg | CHEWABLE_TABLET | ORAL | Status: DC

## 2024-08-11 MED ORDER — PANTOPRAZOLE SODIUM 40 MG PO TBEC: 40.0000 mg | DELAYED_RELEASE_TABLET | Freq: Every day | ORAL | 0 refills | Status: DC

## 2024-08-11 MED ORDER — LABETALOL HCL 5 MG/ML IV SOLN: 10.0000 mg | INTRAVENOUS | Status: DC | PRN

## 2024-08-11 MED ORDER — NITROGLYCERIN 1 MG/10 ML FOR IR/CATH LAB
INTRA_ARTERIAL | Status: DC
  Filled 2024-08-11: qty 10

## 2024-08-11 MED ORDER — HEPARIN SODIUM (PORCINE) 1000 UNIT/ML IJ SOLN
INTRAMUSCULAR | Status: AC
  Filled 2024-08-11: qty 10

## 2024-08-11 MED ORDER — SODIUM CHLORIDE 0.9% FLUSH: 3.0000 mL | Freq: Two times a day (BID) | INTRAVENOUS | Status: DC

## 2024-08-11 MED ORDER — SODIUM CHLORIDE 0.9% FLUSH: 3.0000 mL | INTRAVENOUS | Status: DC | PRN

## 2024-08-11 MED ORDER — VERAPAMIL HCL 2.5 MG/ML IV SOLN
INTRAVENOUS | Status: AC
  Filled 2024-08-11: qty 2

## 2024-08-11 MED ORDER — SODIUM CHLORIDE 0.9 % IV SOLN
INTRAVENOUS | Status: DC | PRN
  Administered 2024-08-11: 10 mL/h via INTRAVENOUS

## 2024-08-11 MED ORDER — NITROGLYCERIN 0.4 MG SL SUBL
0.4000 mg | SUBLINGUAL_TABLET | SUBLINGUAL | 3 refills | Status: DC | PRN
  Filled 2024-08-11: qty 25, 7d supply, fill #0

## 2024-08-11 MED ORDER — CLOPIDOGREL BISULFATE 300 MG PO TABS
ORAL_TABLET | ORAL | Status: DC | PRN
  Administered 2024-08-11: 600 mg via ORAL

## 2024-08-11 MED ORDER — LIDOCAINE HCL (PF) 1 % IJ SOLN
INTRAMUSCULAR | Status: DC | PRN
  Administered 2024-08-11: 2 mL via INTRADERMAL

## 2024-08-11 MED ORDER — MIDAZOLAM HCL (PF) 2 MG/2ML IJ SOLN
INTRAMUSCULAR | Status: DC | PRN
  Administered 2024-08-11 (×2): 1 mg via INTRAVENOUS

## 2024-08-11 MED ORDER — SODIUM CHLORIDE 0.9 % IV SOLN: INTRAVENOUS | Status: DC

## 2024-08-11 MED ORDER — VERAPAMIL HCL 2.5 MG/ML IV SOLN
INTRAVENOUS | Status: DC | PRN
  Administered 2024-08-11: 10 mL via INTRA_ARTERIAL

## 2024-08-11 MED ORDER — SODIUM CHLORIDE 0.9 % IV SOLN: 250.0000 mL | INTRAVENOUS | Status: DC | PRN

## 2024-08-11 MED ORDER — ROSUVASTATIN CALCIUM 20 MG PO TABS
20.0000 mg | ORAL_TABLET | Freq: Every day | ORAL | 3 refills | Status: DC
  Filled 2024-08-11: qty 90, 90d supply, fill #0

## 2024-08-11 MED ORDER — CLOPIDOGREL BISULFATE 300 MG PO TABS
ORAL_TABLET | ORAL | Status: AC
  Filled 2024-08-11: qty 2

## 2024-08-11 MED ORDER — ONDANSETRON HCL 4 MG/2ML IJ SOLN: 4.0000 mg | Freq: Four times a day (QID) | INTRAMUSCULAR | Status: DC | PRN

## 2024-08-11 MED ORDER — HEPARIN SODIUM (PORCINE) 1000 UNIT/ML IJ SOLN
INTRAMUSCULAR | Status: DC | PRN
  Administered 2024-08-11: 4000 [IU] via INTRAVENOUS
  Administered 2024-08-11: 3000 [IU] via INTRAVENOUS
  Administered 2024-08-11: 4000 [IU] via INTRAVENOUS
  Administered 2024-08-11: 2000 [IU] via INTRAVENOUS

## 2024-08-11 MED ORDER — ADENOSINE 12 MG/4ML IV SOLN
INTRAVENOUS | Status: AC
  Filled 2024-08-11: qty 16

## 2024-08-11 MED ORDER — MIDAZOLAM HCL 2 MG/2ML IJ SOLN
INTRAMUSCULAR | Status: AC
  Filled 2024-08-11: qty 2

## 2024-08-11 MED ORDER — CLOPIDOGREL BISULFATE 75 MG PO TABS
75.0000 mg | ORAL_TABLET | Freq: Every day | ORAL | 2 refills | Status: DC
  Filled 2024-08-11: qty 90, 90d supply, fill #0

## 2024-08-11 MED ORDER — INSULIN ASPART 100 UNIT/ML IJ SOLN: 3.0000 [IU] | Freq: Once | INTRAMUSCULAR | Status: DC

## 2024-08-11 MED ORDER — ASPIRIN 81 MG PO TBEC
81.0000 mg | DELAYED_RELEASE_TABLET | Freq: Every day | ORAL | 0 refills | Status: AC
  Filled 2024-08-11: qty 14, 14d supply, fill #0

## 2024-08-11 MED ORDER — HYDRALAZINE HCL 20 MG/ML IJ SOLN: 10.0000 mg | INTRAMUSCULAR | Status: DC | PRN

## 2024-08-11 MED ORDER — CLOPIDOGREL BISULFATE 75 MG PO TABS: 75.0000 mg | ORAL_TABLET | Freq: Every day | ORAL | Status: DC

## 2024-08-11 MED ORDER — NITROGLYCERIN 1 MG/10 ML FOR IR/CATH LAB
INTRA_ARTERIAL | Status: DC | PRN
  Administered 2024-08-11 (×3): 200 ug via INTRACORONARY

## 2024-08-11 MED ORDER — FREE WATER: 500.0000 mL | Freq: Once | Status: DC

## 2024-08-11 MED ORDER — ACETAMINOPHEN 325 MG PO TABS: 650.0000 mg | ORAL_TABLET | ORAL | Status: DC | PRN

## 2024-08-11 MED ORDER — IOHEXOL 350 MG/ML SOLN
INTRAVENOUS | Status: DC | PRN
  Administered 2024-08-11: 190 mL

## 2024-08-11 MED ORDER — LIDOCAINE HCL (PF) 1 % IJ SOLN
INTRAMUSCULAR | Status: AC
  Filled 2024-08-11: qty 30

## 2024-08-11 SURGICAL SUPPLY — 18 items
BALLOON EMERGE MR 3.0X12 (BALLOONS) IMPLANT
BALLOON SAPPHIRE NC24 3.75X12 (BALLOONS) IMPLANT
BALLOON SAPPHIRE NC24 3.75X8 (BALLOONS) IMPLANT
BALLOON ~~LOC~~ EMERGE MR 3.5X12 (BALLOONS) IMPLANT
CATH INFINITI AMBI 5FR TG (CATHETERS) IMPLANT
CATH LAUNCHER 6FR EBU3.5 (CATHETERS) IMPLANT
CATH LAUNCHER 6FR JR4 (CATHETERS) IMPLANT
DEVICE RAD COMP TR BAND LRG (VASCULAR PRODUCTS) IMPLANT
GLIDESHEATH SLEND A-KIT 6F 22G (SHEATH) IMPLANT
GUIDEWIRE INQWIRE 1.5J.035X260 (WIRE) IMPLANT
GUIDEWIRE PRESSURE X 175 (WIRE) IMPLANT
KIT ENCORE 26 ADVANTAGE (KITS) IMPLANT
KIT HEMO VALVE WATCHDOG (MISCELLANEOUS) IMPLANT
PACK CARDIAC CATHETERIZATION (CUSTOM PROCEDURE TRAY) ×1 IMPLANT
SET ATX-X65L (MISCELLANEOUS) IMPLANT
SHEATH PROBE COVER 6X72 (BAG) IMPLANT
STENT SYNERGY XD 3.50X20 (Permanent Stent) IMPLANT
WIRE ASAHI PROWATER 180CM (WIRE) IMPLANT

## 2024-08-11 NOTE — Discharge Instructions (Signed)

## 2024-08-11 NOTE — Interval H&P Note (Signed)
 History and Physical Interval Note:  08/11/2024 8:39 AM  Oscar Moses  has presented today for surgery, with the diagnosis of unstable angina.  The various methods of treatment have been discussed with the patient and family. After consideration of risks, benefits and other options for treatment, the patient has consented to  Procedure(s): LEFT HEART CATH AND CORONARY ANGIOGRAPHY (N/A) as a surgical intervention.  The patient's history has been reviewed, patient examined, no change in status, stable for surgery.  I have reviewed the patient's chart and labs.  Questions were answered to the patient's satisfaction.     Chiquita Heckert J Patience Nuzzo

## 2024-08-11 NOTE — H&P (Signed)
 OV 08/07/2024 copied for documentation   Cardiology Office Note   Date:  08/11/2024  ID:  Oscar Moses, DOB [birth year], MRN [id number] PCP: Onita Norleen, MD  [facility] Providers Cardiologist:  Lonni Cash, MD   History of Present Illness Oscar Moses is a 73 y.o. 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  Was last seen 07/18/2024 in consultation with no prior history of cardiac issues.  Presented to the ED with fatigue and progressive shortness of breath and several episodes of chest pain.  Blood pressure was very high at 198/128, heart rate 101.  No DVT on ultrasound.  CTA negative for pulmonary embolism, moderate bilateral pleural effusions, small pericardial effusion, aortic and coronary calcifications.  Cholelithiasis.  Troponin 31 down to 26 when trended.  Hemoglobin A1c 9.6%.  Hemoglobin 10.6 unremarkable BMP.  proBNP 2857.  Recommended starting IV heparin , IV Lasix , and IV nitroglycerin  for blood pressure and chest pain.  Troponins to be trended.  Patient went into A-fib with RVR with symptoms of shortness of breath.  Given IV and oral Lopressor  and converted back to normal sinus rhythm.  On interview was having dyspnea on exertion which was progressively worsening.  Then about 6 months ago started noticing chest pain with exertion.  Chest pain was described as heaviness on the left side and once he reached a 4 out of 10 the patient sits down and that eventually goes away with rest.  Did report 2 months ago started to notice orthopnea and PND.  Did have some bilateral lower extremity edema which was worse on the left that started about 2 weeks ago.  Also endorsed lightheadedness and dizziness with positional changes.  He states that he had a CCTA back with Dr.  Onita couple months ago and was told it was normal.  Unable to review the chart given out of system.  Initially planned for inpatient cardiac catheterization however creatinine spiked from 1.4-2.2 and cath was deferred for this reason.  Outpatient workup was recommended  Today, he presents with atrial fibrillation who has shortness of breath and chest pressure.  He has had shortness of breath and a sensation of chest pressure, described as feeling like a small child sitting on him, for about two weeks. He notes low energy and persistent leg swelling. He feels lightheaded and dizzy with position changes but has no significant chest pain.  Prior coronary CT showed no coronary calcium . Echocardiogram showed preserved left ventricular systolic function at 55-60% with mild diastolic dysfunction, moderately enlarged atria, mild mitral regurgitation, and a small pericardial effusion.  He was previously hospitalized when a planned cardiac catheterization was deferred due to acute kidney injury with creatinine peaking at 2.2, now improved to 1.5. He received Lasix  with improvement in fluid retention and renal function.  Current medications include amiodarone , hydralazine , amlodipine , Lasix , metoprolol , and an anticoagulant. He reports new ankle swelling since starting amlodipine .  He can complete daily tasks such as mowing the lawn but develops dyspnea and fatigue afterward and often needs to stop and rest in a chair. He has a family history of heart disease.   No orthopnea, PND. Reports no palpitations.   Discussed the use of AI scribe software for clinical note transcription with the patient, who gave verbal consent to proceed.   ROS: Pertinent ROS in HPI  Studies Reviewed  Echocardiogram 07/19/2024 IMPRESSIONS     1. Left ventricular ejection fraction, by estimation, is 55 to 60%. The  left ventricle has normal function. The left ventricle has no regional  wall motion abnormalities. Left  ventricular diastolic parameters are  consistent with Grade I diastolic  dysfunction (impaired relaxation).   2. Right ventricular systolic function is normal. The right ventricular  size is normal.   3. Left atrial size was moderately dilated.   4. Right atrial size was mildly dilated.   5. A small pericardial effusion is present. There is no evidence of  cardiac tamponade.   6. The mitral valve is normal in structure. Mild mitral valve  regurgitation. No evidence of mitral stenosis.   7. The aortic valve is tricuspid. Aortic valve regurgitation is not  visualized. No aortic stenosis is present.   8. The inferior vena cava is dilated in size with <50% respiratory  variability, suggesting right atrial pressure of 15 mmHg.    Risk Assessment/Calculations  CHA2DS2-VASc Score = 4  This indicates a 4.8% annual risk of stroke. The patient's score is based upon: CHF History: 0 HTN History: 1 Diabetes History: 1 Stroke History: 0 Vascular Disease History: 1 Age Score: 1 Gender Score: 0       Physical Exam VS:  BP (!) 154/71   Pulse 63   Temp 97.6 F (36.4 C) (Oral)   Resp 18   Ht 5' 9.5 (1.765 m)   Wt 73.5 kg   SpO2 98%   BMI 23.58 kg/m        Wt Readings from Last 3 Encounters:  08/11/24 73.5 kg  08/07/24 73.5 kg  07/24/24 68.7 kg    GEN: Well nourished, well developed in no acute distress NECK: No JVD; No carotid bruits CARDIAC: RRR, no murmurs, rubs, gallops RESPIRATORY:  Clear to auscultation without rales, wheezing or rhonchi  ABDOMEN: Soft, non-tender, non-distended EXTREMITIES:  No edema; No deformity   ASSESSMENT AND PLAN  Paroxysmal atrial fibrillation with acute on chronic combined systolic and diastolic heart failure Currently in normal sinus rhythm. Echocardiogram shows moderately enlarged atria and slightly impaired relaxation. Mild mitral valve leak and small pericardial effusion noted. Symptoms may be related to atrial fibrillation or angina. -  Continue amiodarone , hydralazine , Norvasc , amlodipine , Lasix , metoprolol , and xarelto  - Scheduled cardiac catheterization to evaluate for coronary artery disease. - Ordered labs to monitor kidney function before catheterization. - Provided guidance on blood thinner management before procedure.  Unstable angina Intermittent chest pressure suggestive of angina. Previous CT scan showed no coronary calcium  buildup. Discussed risks of cardiac catheterization including rare risks of death, heart attack, and stroke. - Scheduled cardiac catheterization to evaluate for coronary artery disease. - Discussed potential for stenting during catheterization if significant blockages are found.  Acute kidney injury Previous creatinine levels elevated but improving with Lasix . Last creatinine was 1.5, down from 2.2. - Ordered labs to monitor kidney function before cardiac catheterization. - Will provide IV hydration if needed before and after catheterization.  Essential hypertension Blood pressure managed with hydralazine , Norvasc , and amlodipine . Amlodipine  may be contributing to ankle swelling. - Monitor blood pressure and adjust medications as needed post-catheterization.  Hyperlipidemia LDL cholesterol is well-controlled at 31. - Continue current lipid management regimen.  The patient understands that risks include but are not limited to stroke (1 in 1000), death (1 in 1000), kidney failure [usually temporary] (1 in 500), bleeding (1 in 200), allergic reaction [possibly serious] (1 in 200), and agrees to proceed.  Dispo: He can follow-up postcardiac catheterization  Signed, Newman JINNY Lawrence, MD

## 2024-08-11 NOTE — Progress Notes (Signed)
 CARDIAC REHAB PHASE I     Post stent education including site care, restrictions, risk factors, exercise guidelines, NTG use, antiplatelet therapy importance, heart healthy diabetic diet, smoking cessation and CRP2 reviewed. All questions and concerns addressed. Will refer to [facility] for CRP2. Plan for home later today.    [id number] Vaughn Asberry Hacking, RN BSN 08/11/2024 12:42 PM

## 2024-08-11 NOTE — Interval H&P Note (Signed)
 History and Physical Interval Note:  08/11/2024 9:00 AM  Oscar Moses  has presented today for surgery, with the diagnosis of unstable angina.  The various methods of treatment have been discussed with the patient and family. After consideration of risks, benefits and other options for treatment, the patient has consented to  Procedure(s): LEFT HEART CATH AND CORONARY ANGIOGRAPHY (N/A) as a surgical intervention.  The patient's history has been reviewed, patient examined, no change in status, stable for surgery.  I have reviewed the patient's chart and labs.  Questions were answered to the patient's satisfaction.     Fredrick Dray J Tashaya Ancrum

## 2024-08-11 NOTE — Progress Notes (Signed)
 TR band removed at 1330, gauze dressing applied. Right radial level 0, clean, dry, and intact.

## 2024-08-11 NOTE — Progress Notes (Signed)
 Pt already received discharge instructions, right radial site is clean dry intact. No signs of bleeding. IV's removed, no complications. Pt escorted out via wheelchair to son's vehicle.

## 2024-08-11 NOTE — Discharge Summary (Signed)
 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)   Chronic kidney disease (CKD), stage III (moderate) (HCC)    Diagnostic Studies/Procedures    Cardiac Catheterization 08/11/2024: LM: Normal LAD: Prox-mid 30% disease, mid focal eccentric 50% stenosis          RFR 0.85 in mid LAD, with step up across the lesion in mid LAD after large diag Lcx: Prox20%, mid 20% disease RCA: Prox 40% stenosis, RFR 0.92   LVEDP 19 mmHg   Successful percutaneous coronary intervention mid LAD        PTCA and stent placement 3.5 X 20 mm Synergy drug-eluting stent        Postdilatation using 3.5 x 12 mm, 3.75 x 12 mm, and 3.75 x 8 mm [place] balloons up to 24 atm      Minimal waist remained in the stent.  Pre-PCI RFR of 0.85 in mid LAD, improved to 0.88 immediately distal to the stent post PCI.  This was in spite of maximal post stent dilatation.  I expect slow improvement in symptoms, but may not completely resolved.  In future, may need to consider OPN balloon postdilatation and/or lithotripsy or slightly underexpanded stent.   Contrast used: 190 cc Recommend IV hydration with normal saline 125 cc for 6 hours _____________   History of Present Illness     Oscar Moses is a 73 y.o. 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   He was recently admitted for unstable angina, acute diastolic CHF, and hypertensive urgency. Echo showed LVEF of 55-60% with grade 1 diastolic dysfunction, normal RV function, mild MR, and a small pericardial effusion. He was diuresed with IV Lasix . Initial plan was for cardiac catheterization during this admission but this was deferred given worsening renal function and plan was to re-arrange as an outpatient. Hospitalization was also complicated by acute on chronic anemia. EGD showed angioplastic lesions in ascending colon that were coagulated, multiple diverticula and 3 small AVMs.  He was seen by Orren Fabry, PA-C, on 08/07/2024 at which time he continued to reports some shortness of breath and chest pressure. Cardiac catheterization was arranged for further evaluation.  Hospital Course     Patient presented to [facility]/11/[facility]24 and underwent cardiac catheterization as noted above. LHC showed  proximal to mid 30% stenosis of LAD followed by 50% focal eccentric stenosis of mid LAD (RFR was 0.85 in the mid LAD), 20% stenosis of proximal and mid LCX, and 40% stenosis of proximal RCA. He underwent successful PCI with PTCA and DES of mid LAD. Repeat RFR following PCI was 0.88 in spite of maximal post stent dilatation. He received 190 cc of contrast dye. Therefore, he was was hydrated with NaCl 125 cc for 6 hours post-cath. Plan is for triple therapy with Aspirin  81mg  daily, Plavix  75mg  daily, and Xarelto  15mg  daily for 2  weeks at which time Aspirin  can be stopped. Plavix  should be continued for at least 6 months. Xarelto  can be restarted on 08/12/2024. Will also provide a prescription of Protonix  40mg  daily for patient to take while on triple therapy. The patient was seen by Cardiac Rehab while in [place] stay. There were no observed complications post cath. Right radial cath site was re-evaluated prior to discharge and found to be stable without any complications.  Instructions/precautions regarding cath site care were given prior to discharge.  Norleen LELON Garner was seen by Dr. Elmira and determined stable for discharge home. Follow-up with our office has been arranged. Medications are listed below. Pertinent changes include addition of Aspirin , Plavix , and Protonix . Simvastatin  was also stopped and he was switched to Crestor  20mg  daily. He will need repeat lipid panel and LFTs in 2-3 months.    _____________  Cath/PCI Registry Performance & Quality Measures: Aspirin  prescribed? - Yes ADP Receptor Inhibitor (Plavix /Clopidogrel , Brilinta/Ticagrelor or Effient/Prasugrel) prescribed (includes medically managed patients)? - Yes High Intensity Statin (Lipitor 40-80mg  or Crestor  20-40mg ) prescribed? - Yes For EF <40%, was ACEI/ARB prescribed? - Not Applicable (EF >/= 40%) For EF <40%, Aldosterone Antagonist (Spironolactone  or Eplerenone) prescribed? - Not Applicable (EF >/= 40%) Cardiac Rehab Phase II ordered (Included Medically managed Patients)? - Yes  _____________   Discharge Vitals Blood pressure (!) 151/60, pulse 64, temperature 97.6 F (36.4 C), temperature source Oral, resp. rate 18, height 5' 9.5 (1.765 m), weight 73.5 kg, SpO2 92%.  Filed Weights   08/11/24 0722  Weight: 73.5 kg    Last Labs & Radiologic Studies    CBC No results for input(s): WBC, NEUTROABS, HGB, HCT, MCV, PLT in the last 72 hours. Basic Metabolic Panel No results for input(s): NA, K, CL, CO2, GLUCOSE, BUN, CREATININE, CALCIUM , MG, PHOS in the last 72 hours. Liver Function Tests No results for input(s): AST, ALT, ALKPHOS, BILITOT, PROT, ALBUMIN in the last 72 hours. No results for input(s): LIPASE, AMYLASE in the last 72 hours. High Sensitivity Troponin:   No results for input(s): TROPONINIHS in the last 720 hours.  BNP Invalid input(s): POCBNP D-Dimer No results for input(s): DDIMER in the last 72  hours. Hemoglobin A1C No results for input(s): HGBA1C in the last 72 hours. Fasting Lipid Panel No results for input(s): CHOL, HDL, LDLCALC, TRIG, CHOLHDL, LDLDIRECT in the last 72 hours. Thyroid Function Tests No results for input(s): TSH, T4TOTAL, T3FREE, THYROIDAB in the last 72 hours.  Invalid input(s): FREET3 _____________  CARDIAC CATHETERIZATION Result Date: 08/11/2024 Images from the original result were not included.   Recommend triple therapy with aspirin , Plavix , Xarelto  for 2 weeks. After that, stop aspirin , continue Plavix  and Xarelto  for at least 6 months. After that, if no recurrent anginal symptoms, could consider stopping Plavix  and continuing Xarelto . Coronary angiography 08/11/2024: LM: Normal LAD: Prox-mid 30% disease, mid focal eccentric 50% stenosis          RFR 0.85 in mid LAD, with step up across the lesion in mid LAD after large diag Lcx: Prox20%, mid 20% disease RCA: Prox 40% stenosis, RFR 0.92 LVEDP 19 mmHg Successful percutaneous coronary intervention mid LAD        PTCA and stent placement 3.5 X 20 mm Synergy drug-eluting stent        Postdilatation using 3.5 x 12 mm, 3.75 x 12 mm, and 3.75 x 8 mm [place] balloons up to 24 atm Minimal waist remained in the stent.  Pre-PCI RFR of 0.85 in mid LAD,  improved to 0.88 immediately distal to the stent post PCI.  This was in spite of maximal post stent dilatation.  I expect slow improvement in symptoms, but may not completely resolved.  In future, may need to consider OPN balloon postdilatation and/or lithotripsy or slightly underexpanded stent. Contrast used: 190 cc Recommend IV hydration with normal saline 125 cc for 6 hours Manish JINNY Lawrence, MD   DG Chest 2 View Result Date: 07/23/2024 CLINICAL DATA:  Shortness of breath.  Pleural effusions. EXAM: DG CHEST 2V COMPARISON:  Radiographs and CT 07/17/2024. FINDINGS: The heart size and mediastinal contours are stable. Persistent bilateral pleural effusions appear  slightly larger based on the lateral view. The overall pulmonary aeration has improved, and there is no edema, confluent airspace disease or pneumothorax. Postsurgical changes from previous lower cervical fusion and mild thoracic spine degenerative changes are noted. IMPRESSION: Slightly increased bilateral pleural effusions. No evidence of pneumonia or edema. Electronically Signed   By: Elsie Perone M.D.   On: 07/23/2024 11:27   ECHOCARDIOGRAM COMPLETE Result Date: 07/19/2024    ECHOCARDIOGRAM REPORT   Patient Name:   Oscar Moses Date of Exam: 07/19/2024 Medical Rec #:  [id number]    Height:       69.0 in Accession #:    [id number]   Weight:       157.5 lb Date of Birth:  [birth year]    BSA:          1.867 m Patient Age:    73 years     BP:           126/60 mmHg Patient Gender: M            HR:           70 bpm. Exam Location:  Inpatient Procedure: 2D Echo, Color Doppler and Cardiac Doppler (Both Spectral and Color            Flow Doppler were utilized during procedure). Indications:    NSTEMI i21.4  History:        Patient has no prior history of Echocardiogram examinations.                 COPD; Risk Factors:Hypertension, Diabetes and Dyslipidemia.  Sonographer:    Merlynn Argyle Referring Phys: [id number] LOGAN N LOCKWOOD IMPRESSIONS  1. Left ventricular ejection fraction, by estimation, is 55 to 60%. The left ventricle has normal function. The left ventricle has no regional wall motion abnormalities. Left ventricular diastolic parameters are consistent with Grade I diastolic dysfunction (impaired relaxation).  2. Right ventricular systolic function is normal. The right ventricular size is normal.  3. Left atrial size was moderately dilated.  4. Right atrial size was mildly dilated.  5. A small pericardial effusion is present. There is no evidence of cardiac tamponade.  6. The mitral valve is normal in structure. Mild mitral valve regurgitation. No evidence of mitral stenosis.  7. The aortic valve is tricuspid.  Aortic valve regurgitation is not visualized. No aortic stenosis is present.  8. The inferior vena cava is dilated in size with <50% respiratory variability, suggesting right atrial pressure of 15 mmHg. FINDINGS  Left Ventricle: Left ventricular ejection fraction, by estimation, is 55 to 60%. The left ventricle has normal function. The left ventricle has no regional wall motion abnormalities. The left ventricular internal cavity size was normal in size. There is  no left ventricular hypertrophy. Left ventricular diastolic parameters are consistent with Grade I diastolic dysfunction (impaired relaxation). Right Ventricle:  The right ventricular size is normal. No increase in right ventricular wall thickness. Right ventricular systolic function is normal. Left Atrium: Left atrial size was moderately dilated. Right Atrium: Right atrial size was mildly dilated. Pericardium: A small pericardial effusion is present. There is no evidence of cardiac tamponade. Mitral Valve: The mitral valve is normal in structure. Mild mitral valve regurgitation. No evidence of mitral valve stenosis. Tricuspid Valve: The tricuspid valve is normal in structure. Tricuspid valve regurgitation is not demonstrated. No evidence of tricuspid stenosis. Aortic Valve: The aortic valve is tricuspid. Aortic valve regurgitation is not visualized. No aortic stenosis is present. Pulmonic Valve: The pulmonic valve was normal in structure. Pulmonic valve regurgitation is not visualized. No evidence of pulmonic stenosis. Aorta: The aortic root is normal in size and structure. Venous: The inferior vena cava is dilated in size with less than 50% respiratory variability, suggesting right atrial pressure of 15 mmHg. IAS/Shunts: No atrial level shunt detected by color flow Doppler.  LEFT VENTRICLE PLAX 2D LVIDd:         4.90 cm   Diastology LVIDs:         3.60 cm   LV e' medial:    5.84 cm/s LV PW:         1.10 cm   LV E/e' medial:  13.9 LV IVS:        1.20 cm    LV e' lateral:   7.22 cm/s LVOT diam:     2.30 cm   LV E/e' lateral: 11.2 LV SV:         79 LV SV Index:   42 LVOT Area:     4.15 cm LV IVRT:       130 msec  RIGHT VENTRICLE             IVC RV Basal diam:  3.95 cm     IVC diam: 2.40 cm RV S prime:     12.90 cm/s TAPSE (M-mode): 2.4 cm LEFT ATRIUM             Index        RIGHT ATRIUM           Index LA diam:        4.50 cm 2.41 cm/m   RA Area:     22.20 cm LA Vol (A2C):   86.2 ml 46.18 ml/m  RA Volume:   69.50 ml  37.23 ml/m LA Vol (A4C):   80.7 ml 43.23 ml/m LA Biplane Vol: 87.6 ml 46.93 ml/m  AORTIC VALVE LVOT Vmax:   91.60 cm/s LVOT Vmean:  61.400 cm/s LVOT VTI:    0.189 m  AORTA Ao Root diam: 3.50 cm MITRAL VALVE MV Area (PHT): 2.39 cm    SHUNTS MV Decel Time: 318 msec    Systemic VTI:  0.19 m MV E velocity: 80.90 cm/s  Systemic Diam: 2.30 cm MV A velocity: 74.70 cm/s MV E/A ratio:  1.08 Morene Brownie Electronically signed by Morene Brownie Signature Date/Time: 07/19/2024/12:35:01 PM    Final    US  Venous Img Lower Bilateral Result Date: 07/17/2024 CLINICAL DATA:  73 year old male with lower extremity swelling. EXAM: BILATERAL LOWER EXTREMITY VENOUS DOPPLER ULTRASOUND TECHNIQUE: Gray-scale sonography with graded compression, as well as color Doppler and duplex ultrasound were performed to evaluate the lower extremity deep venous systems from the level of the common femoral vein and including the common femoral, femoral, profunda femoral, popliteal and calf veins including the posterior tibial, peroneal and gastrocnemius veins when  visible. The superficial great saphenous vein was also interrogated. Spectral Doppler was utilized to evaluate flow at rest and with distal augmentation maneuvers in the common femoral, femoral and popliteal veins. COMPARISON:  None Available. FINDINGS: RIGHT LOWER EXTREMITY Common Femoral Vein: No evidence of thrombus. Normal compressibility, respiratory phasicity and response to augmentation. Saphenofemoral Junction:  No evidence of thrombus. Normal compressibility and flow on color Doppler imaging. Profunda Femoral Vein: No evidence of thrombus. Normal compressibility and flow on color Doppler imaging. Femoral Vein: No evidence of thrombus. Normal compressibility, respiratory phasicity and response to augmentation. Popliteal Vein: No evidence of thrombus. Normal compressibility, respiratory phasicity and response to augmentation. Calf Veins: No evidence of thrombus. Normal compressibility and flow on color Doppler imaging. Other Findings:  None. LEFT LOWER EXTREMITY Common Femoral Vein: No evidence of thrombus. Normal compressibility, respiratory phasicity and response to augmentation. Saphenofemoral Junction: No evidence of thrombus. Normal compressibility and flow on color Doppler imaging. Profunda Femoral Vein: No evidence of thrombus. Normal compressibility and flow on color Doppler imaging. Femoral Vein: No evidence of thrombus. Normal compressibility, respiratory phasicity and response to augmentation. Popliteal Vein: No evidence of thrombus. Normal compressibility, respiratory phasicity and response to augmentation. Calf Veins: No evidence of thrombus. Normal compressibility and flow on color Doppler imaging. Other Findings:  None. IMPRESSION: No evidence of bilateral lower extremity deep venous thrombosis. Ester Sides, MD [facility] Radiology Electronically Signed   By: Ester Sides M.D.   On: 07/17/2024 16:08   CT Angio Chest PE W and/or Wo Contrast Result Date: 07/17/2024 CLINICAL DATA:  Pulmonary embolism suspected. Difficulty breathing, chest pain. EXAM: CT ANGIOGRAPHY CHEST WITH CONTRAST TECHNIQUE: Multidetector CT imaging of the chest was performed using the standard protocol during bolus administration of intravenous contrast. Multiplanar CT image reconstructions and MIPs were obtained to evaluate the vascular anatomy. RADIATION DOSE REDUCTION: This exam was  performed according to the departmental dose-optimization program which includes automated exposure control, adjustment of the mA and/or kV according to patient size and/or use of iterative reconstruction technique. CONTRAST:  80mL OMNIPAQUE  IOHEXOL  350 MG/ML SOLN COMPARISON:  None Available. FINDINGS: Cardiovascular: Negative for pulmonary embolus. Atherosclerotic calcification of the aorta, aortic valve and coronary arteries. Heart is enlarged. Small pericardial effusion. Mediastinum/Nodes: Thoracic inlet lymph nodes are not enlarged by CT size criteria. No pathologically enlarged mediastinal, hilar or axillary lymph nodes. Esophagus is grossly unremarkable. Lungs/Pleura: Image quality is degraded by expiratory phase imaging, creating added density in the lungs. Dependent atelectasis. Moderate bilateral pleural effusions. Calcified granulomas. Airway is unremarkable. Upper Abdomen: Gallstone. Low-attenuation lesion in the right kidney, incompletely visualized. No specific follow-up necessary. Probable renal vascular calcification on the right. Visualized portions of the liver, gallbladder, adrenal glands, kidneys, spleen, pancreas, stomach and bowel are otherwise grossly unremarkable. No upper abdominal adenopathy. Musculoskeletal: Degenerative changes in the spine.  Kyphosis. Review of the MIP images confirms the above findings. IMPRESSION: 1. Negative for pulmonary embolus. 2. Moderate bilateral pleural effusions. 3. Small pericardial effusion. 4. Cholelithiasis. 5. Aortic atherosclerosis (ICD10-I70.0). Coronary artery calcification. Electronically Signed   By: Newell Eke M.D.   On: 07/17/2024 13:46   DG Chest 2 View Result Date: 07/17/2024 EXAM: 2 VIEW(S) XRAY OF THE CHEST 07/17/2024 11:37:00 AM COMPARISON: None available. CLINICAL HISTORY: Monter of breath FINDINGS: LUNGS AND PLEURA: Right basilar opacities. Blunting of bilateral costophrenic angles, right greater than left. No pulmonary edema. No  pneumothorax. HEART AND MEDIASTINUM: No acute abnormality of the cardiac and mediastinal silhouettes. BONES AND  SOFT TISSUES: No acute osseous abnormality. IMPRESSION: 1. Right basilar opacities. 2. Blunting of bilateral costophrenic angles, right greater than left. Electronically signed by: Norleen Boxer MD 07/17/2024 12:21 PM EST RP Workstation: [id number]    Disposition   Patient is being discharged home today in good condition.  Follow-up Plans & Appointments     Discharge Instructions     Amb Referral to Cardiac Rehabilitation   Complete by: As directed    Diagnosis: Coronary Stents   After initial evaluation and assessments completed: Virtual Based Care may be provided alone or in conjunction with Phase 2 Cardiac Rehab based on patient barriers.: Yes   Intensive Cardiac Rehabilitation (ICR) [facility] location only OR Traditional Cardiac Rehabilitation (TCR) *If criteria for ICR are not met will enroll in TCR [facility] only): Yes        Discharge Medications   Allergies as of 08/11/2024   No Known Allergies      Medication List     STOP taking these medications    EXCEDRIN PO   simvastatin  20 MG tablet Commonly known as: ZOCOR        TAKE these medications    amiodarone  200 MG tablet Commonly known as: PACERONE  Take 1 tablet (200 mg total) by mouth 2 (two) times daily for 10 days, THEN 1 tablet (200 mg total) daily. Follow with cardiology outpatient for refills. Start taking on: July 25, 2024 What changed: See the new instructions.   amLODipine  10 MG tablet Commonly known as: NORVASC  Take 1 tablet (10 mg total) by mouth daily. Follow with PCP or cards for refills   aspirin  EC 81 MG tablet Take 1 tablet (81 mg total) by mouth daily.   clopidogrel  75 MG tablet Commonly known as: PLAVIX  Take 1 tablet (75 mg total) by mouth daily with breakfast. Start taking on: August 12, 2024   fluticasone 50 MCG/ACT nasal spray Commonly known as: FLONASE Place 1 spray into  both nostrils daily as needed for allergies or rhinitis.   FreeStyle Libre 3 Sensor Misc 1 each by Other route every 14 (fourteen) days.   furosemide  20 MG tablet Commonly known as: Lasix  Take 1 tablet (20 mg total) by mouth daily. Follow with your PCP and/or cardiology for repeat labs within a week.   hydrALAZINE  100 MG tablet Commonly known as: APRESOLINE  Take 1 tablet (100 mg total) by mouth every 8 (eight) hours. Follow with PCP or cards for refills   HYDROcodone -acetaminophen  10-325 MG tablet Commonly known as: NORCO Take 1 tablet by mouth 4 (four) times daily as needed.   insulin  lispro 100 UNIT/ML KwikPen Commonly known as: HUMALOG See admin instructions. Use per sliding scale three to four times daily as directed.   Melatonin 10 MG Tabs Take 20 mg by mouth at bedtime.   metoprolol  succinate 50 MG 24 hr tablet Commonly known as: TOPROL -XL Take 1 tablet (50 mg total) by mouth daily. Take with or immediately following a meal.   MULTIVITAMIN ADULT PO Take 1 tablet by mouth every morning.   nitroGLYCERIN  0.4 MG SL tablet Commonly known as: Nitrostat  Place 1 tablet (0.4 mg total) under the tongue every 5 (five) minutes as needed.   pantoprazole  40 MG tablet Commonly known as: Protonix  Take 1 tablet (40 mg total) by mouth daily.   potassium chloride  10 MEQ tablet Commonly known as: KLOR-CON  M Take 1 tablet (10 mEq total) by mouth daily. Follow with PCP or cards for repeat labs within a week   PREDNISOLONE ACETATE OP Place  1 drop into both eyes daily.   rosuvastatin  20 MG tablet Commonly known as: Crestor  Take 1 tablet (20 mg total) by mouth daily.   traZODone  50 MG tablet Commonly known as: DESYREL  Take 75 mg by mouth at bedtime as needed.   Tresiba FlexTouch 100 UNIT/ML FlexTouch Pen Generic drug: insulin  degludec Inject 12 Units into the skin at bedtime.   VITAMIN B-12 PO Take 1 tablet by mouth every morning.   Xarelto  15 MG Tabs tablet Generic drug:  Rivaroxaban  Take 1 tablet (15 mg total) by mouth daily with supper. Follow with cardiology or PCP for refills.           Allergies No Known Allergies  Outstanding Labs/Studies   Repeat lipid panel and LFTs in 2-3 months.  Duration of Discharge Encounter   Greater than 20 minutes including physician time.  Signed, Santiana Glidden E Varick Keys, PA-C 08/11/2024, 2:37 PM

## 2024-08-13 ENCOUNTER — Encounter (HOSPITAL_COMMUNITY): Payer: Self-pay | Admitting: Cardiology

## 2024-08-14 ENCOUNTER — Telehealth (HOSPITAL_COMMUNITY): Payer: Self-pay

## 2024-08-14 MED FILL — Nitroglycerin IV Soln 100 MCG/ML in D5W: INTRA_ARTERIAL | Qty: 10 | Status: CN

## 2024-08-14 NOTE — Telephone Encounter (Signed)
 Pt is not interested in the cardiac rehab program at this time. Closed referral.

## 2024-08-14 NOTE — Progress Notes (Signed)
 Advised to call Dr Gilda office and client voiced understanding

## 2024-08-17 ENCOUNTER — Ambulatory Visit: Admitting: Gastroenterology

## 2024-08-17 ENCOUNTER — Encounter: Payer: Self-pay | Admitting: Gastroenterology

## 2024-08-17 VITALS — BP 122/62 | HR 62 | Ht 69.5 in | Wt 169.0 lb

## 2024-08-17 DIAGNOSIS — I4891 Unspecified atrial fibrillation: Secondary | ICD-10-CM

## 2024-08-17 DIAGNOSIS — Z87891 Personal history of nicotine dependence: Secondary | ICD-10-CM

## 2024-08-17 DIAGNOSIS — K5520 Angiodysplasia of colon without hemorrhage: Secondary | ICD-10-CM | POA: Diagnosis not present

## 2024-08-17 DIAGNOSIS — D649 Anemia, unspecified: Secondary | ICD-10-CM

## 2024-08-17 DIAGNOSIS — I2510 Atherosclerotic heart disease of native coronary artery without angina pectoris: Secondary | ICD-10-CM

## 2024-08-17 DIAGNOSIS — I480 Paroxysmal atrial fibrillation: Secondary | ICD-10-CM

## 2024-08-17 DIAGNOSIS — Z860100 Personal history of colon polyps, unspecified: Secondary | ICD-10-CM | POA: Diagnosis not present

## 2024-08-17 NOTE — Progress Notes (Signed)
 Chief Complaint:    Hospital follow-up, anemia, colon AVM   HPI:     Patient is a 73 y.o. male with a history of HTN, A-fib (on Xarelto ), CAD (ASA/Plavix ), COPD, CKD, COPD, reported celiac disease, diabetes with retinopathy and nephropathy, CKD 3, GERD, presenting to the [facility] for hospital follow-up.   Hospital admission in November for unstable angina, acute CHF, A-fib with RVR, AKI.  GI service was consulted for downtrending hemoglobin and intermittent black stools at home. - 07/19/2024: TTE: LVEF 55-60%, small pericardial effusion, moderate LA enlargement - Treated with amiodarone  IV and heparin  for A-fib with RVR. - 07/23/2024: EGD: 2 cm hiatal hernia, irregular Z-line (intentionally not biopsied), with markedly acute angulation of the duodenal sweep, but otherwise no UGI bleed.  -07/23/2024: Colonoscopy: 2 small nonbleeding ascending colon AVMs treated with APC along with 5 small polyps resected (path: tubular adenomas) and left-sided diverticulosis.  - 07/25/2024: Cardiac cath planned, but postponed due to AKI exacerbation  Underwent cardiac catheterization on 08/11/2024 and notable for LAD 30-50% stenosis, 20% proximal LCx disease, 40% stenosis of RCA and underwent PCI to mid LAD with DES. Has f/u in the Cardiology clinic on 08/25/2024.   Reviewed most recent CBC from 08/07/2024: H/H stable at 9.7/28.6.  BMP at baseline.  No new abdominal imaging for review today. Taking oral iron  daily and B12 daily.   Baseline Hgb prior to admission was ~13.   Taking metamucil with normal, formed stools.   No recent bleeding, no black stools. Tolerating all PO intake.  Otherwise no active GI symptoms today.  Endoscopic History: EGD 07/21/2024: - Esophagogastric landmarks identified.  - 2 cm hiatal hernia.  - Z-line irregular. Possible Hribar segment of BE - biopsies NOT taken to not confound the presentation in case capsule endoscopy is needed  - Normal stomach.  - Duodenal  mucosal changes seen, suspicious for celiac disease.  - Markedly angulated duodenal sweep making for difficult visualization there - multiple passes made - no concerning pathology. No cause for anemia / bleeding noted on this exam.    Colonoscopy 07/22/2024: - Five 2 to 3 mm polyps in the transverse colon, removed with a cold snare. Resected and retrieved.  - Two non-bleeding colonic angiodysplastic lesions. Treated with a monopolar probe.  - Diverticulosis in the sigmoid colon and in the descending colon.  Review of systems:     No chest pain, no SOB, no fevers, no urinary sx   Past Medical History:  Diagnosis Date   Diabetes mellitus without complication (HCC)    History of severe acute respiratory syndrome coronavirus 2 (SARS-CoV-2) disease 10/09/2019    Patient's surgical history, family medical history, social history, medications and allergies were all reviewed in Epic    Current Outpatient Medications  Medication Sig Dispense Refill   amiodarone  (PACERONE ) 200 MG tablet Take 1 tablet (200 mg total) by mouth 2 (two) times daily for 10 days, THEN 1 tablet (200 mg total) daily. Follow with cardiology outpatient for refills. (Patient taking differently: 1 tablet (200 mg total) daily. Follow with cardiology outpatient for refills.) 50 tablet 0   amLODipine  (NORVASC ) 10 MG tablet Take 1 tablet (10 mg total) by mouth daily. Follow with PCP or cards for refills 30 tablet 0   aspirin  EC 81 MG tablet Take 1 tablet (81 mg total) by mouth daily. 14 tablet 0   clopidogrel  (PLAVIX ) 75 MG tablet Take 1 tablet (75 mg total) by mouth daily with breakfast. 90 tablet 2  Continuous Glucose Sensor (FREESTYLE LIBRE 3 SENSOR) MISC 1 each by Other route every 14 (fourteen) days.     Cyanocobalamin (VITAMIN B-12 PO) Take 1 tablet by mouth every morning.     fluticasone (FLONASE) 50 MCG/ACT nasal spray Place 1 spray into both nostrils daily as needed for allergies or rhinitis.     furosemide  (LASIX ) 20 MG  tablet Take 1 tablet (20 mg total) by mouth daily. Follow with your PCP and/or cardiology for repeat labs within a week. 30 tablet 0   hydrALAZINE  (APRESOLINE ) 100 MG tablet Take 1 tablet (100 mg total) by mouth every 8 (eight) hours. Follow with PCP or cards for refills 90 tablet 0   HYDROcodone -acetaminophen  (NORCO) 10-325 MG tablet Take 1 tablet by mouth 4 (four) times daily as needed.     insulin  lispro (HUMALOG) 100 UNIT/ML KwikPen See admin instructions. Use per sliding scale three to four times daily as directed.     Melatonin 10 MG TABS Take 20 mg by mouth at bedtime.     metoprolol  succinate (TOPROL -XL) 50 MG 24 hr tablet Take 1 tablet (50 mg total) by mouth daily. Take with or immediately following a meal. 30 tablet 0   Multiple Vitamin (MULTIVITAMIN ADULT PO) Take 1 tablet by mouth every morning.     nitroGLYCERIN  (NITROSTAT ) 0.4 MG SL tablet Place 1 tablet (0.4 mg total) under the tongue every 5 (five) minutes as needed. 25 tablet 3   pantoprazole  (PROTONIX ) 40 MG tablet Take 1 tablet (40 mg total) by mouth daily. 30 tablet 0   potassium chloride  (KLOR-CON  M) 10 MEQ tablet Take 1 tablet (10 mEq total) by mouth daily. Follow with PCP or cards for repeat labs within a week 30 tablet 0   PREDNISOLONE ACETATE OP Place 1 drop into both eyes daily.     Rivaroxaban  (XARELTO ) 15 MG TABS tablet Take 1 tablet (15 mg total) by mouth daily with supper. Follow with cardiology or PCP for refills. 30 tablet 0   rosuvastatin  (CRESTOR ) 20 MG tablet Take 1 tablet (20 mg total) by mouth daily. 90 tablet 3   traZODone  (DESYREL ) 50 MG tablet Take 75 mg by mouth at bedtime as needed.     TRESIBA FLEXTOUCH 100 UNIT/ML FlexTouch Pen Inject 12 Units into the skin at bedtime.     No current facility-administered medications for this visit.    Physical Exam:     BP 122/62   Pulse 62   Ht 5' 9.5 (1.765 m)   Wt 169 lb (76.7 kg)   BMI 24.60 kg/m   GENERAL:  Pleasant male in NAD PSYCH: : Cooperative,  normal affect EENT:  conjunctiva pink, mucous membranes moist, neck supple without masses CARDIAC:  RRR, no murmur heard, no peripheral edema PULM: Normal respiratory effort, lungs CTA bilaterally, no wheezing ABDOMEN:  Nondistended, soft, nontender. No obvious masses, no hepatomegaly,  normal bowel sounds SKIN:  turgor, no lesions seen Musculoskeletal:  Normal muscle tone, normal strength NEURO: Alert and oriented x 3, no focal neurologic deficits   IMPRESSION and PLAN:    1) Colonic AVMs 2) Anemia Recent hospital admission for unstable angina, CHF, A-fib with RVR, and noted to have downtrending hemoglobin.  EGD unremarkable from a bleeding standpoint, colonoscopy with 2 small nonbleeding ascending colon AVMs treated with APC along with 5 subcentimeter adenomas resected.  No bleeding since hospital discharge and has had stable hemoglobin despite being on ASA/Plavix  and Xarelto . - Repeat CBC and iron  panel in 3 months -  If continued or recurrent anemia, can consider further small bowel interrogation with Video Capsule endoscopy. Will need to consider endoscopic placement due to acute duodenal angulation and elevated risk for capsule retention  3) Atrial fibrillation 4) CAD s/p PCI with DES 5) CHF - Has follow-up in the Cardiology Clinic scheduled.  He will ask about whether or not he needs dual antiplatelet therapy or if ASA 81 mg can be discontinued  6) History of colon polyps - Repeat colonoscopy in 3 years for surveillance   RTC in 6 months or sooner as needed      I spent 35 minutes of time, including in depth chart review, independent review of results as outlined above, communicating results with the patient directly, face-to-face time with the patient, coordinating care, and ordering studies and medications as appropriate, and documentation.       Sandor LULLA Flatter ,DO, FACG 08/17/2024, 9:17 AM

## 2024-08-17 NOTE — Patient Instructions (Addendum)
 _______________________________________________________  If your blood pressure at your visit was 140/90 or greater, please contact your primary care physician to follow up on this.  _______________________________________________________  If you are age 73 or older, your body mass index should be between 23-30. Your Body mass index is 24.6 kg/m. If this is out of the aforementioned range listed, please consider follow up with your Primary Care Provider.  If you are age 42 or younger, your body mass index should be between 19-25. Your Body mass index is 24.6 kg/m. If this is out of the aformentioned range listed, please consider follow up with your Primary Care Provider.   ________________________________________________________  The [facility] providers would like to encourage you to use MYCHART to communicate with providers for non-urgent requests or questions.  Due to long hold times on the telephone, sending your provider a message by [facility] may be a faster and more efficient way to get a response.  Please allow 48 business hours for a response.  Please remember that this is for non-urgent requests.  _______________________________________________________  [facility] is using a team-based approach to care.  Your team is made up of your doctor and two to three APPS. Our APPS (Nurse Practitioners and Physician Assistants) work with your physician to ensure care continuity for you. They are fully qualified to address your health concerns and develop a treatment plan. They communicate directly with your gastroenterologist to care for you. Seeing the Advanced Practice Practitioners on your physician's team can help you by facilitating care more promptly, often allowing for earlier appointments, access to diagnostic testing, procedures, and other specialty referrals.   Your provider has requested that you go to the basement level for lab work in 3 months (March 2026). Press B on the  elevator. The lab is located at the first door on the left as you exit the elevator.  Due to recent changes in healthcare laws, you may see the results of your imaging and laboratory studies on MyChart before your provider has had a chance to review them.  We understand that in some cases there may be results that are confusing or concerning to you. Not all laboratory results come back in the same time frame and the provider may be waiting for multiple results in order to interpret others.  Please give us  48 hours in order for your provider to thoroughly review all the results before contacting the office for clarification of your results.   It was a pleasure to see you today!  Vito Cirigliano, D.O.

## 2024-08-22 NOTE — Progress Notes (Unsigned)
 Cardiology Clinic Note   Patient Name: Oscar Moses Date of Encounter: 08/22/2024  Primary Care Provider:  Onita Norleen, Moses Primary Cardiologist:  Oscar Cash, Moses  Patient Profile    Oscar Moses 73 year old male presents to the clinic today for follow-up evaluation of his coronary artery disease and hyperlipidemia status post LHC on 08/11/2024.  Past Medical History    Past Medical History:  Diagnosis Date   Diabetes mellitus without complication (HCC)    History of severe acute respiratory syndrome coronavirus 2 (SARS-CoV-2) disease 10/09/2019   Past Surgical History:  Procedure Laterality Date   BACK SURGERY     COLONOSCOPY N/A 07/22/2024   Procedure: COLONOSCOPY;  Surgeon: Oscar Dover, Moses;  Location: [facility] ENDOSCOPY;  Service: Gastroenterology;  Laterality: N/A;   CORONARY PRESSURE/FFR STUDY N/A 08/11/2024   Procedure: CORONARY PRESSURE/FFR STUDY;  Surgeon: Oscar Oscar Moses;  Location: [facility] INVASIVE CV LAB;  Service: Cardiovascular;  Laterality: N/A;   CORONARY STENT INTERVENTION N/A 08/11/2024   Procedure: CORONARY STENT INTERVENTION;  Surgeon: Oscar Oscar Moses;  Location: [facility] INVASIVE CV LAB;  Service: Cardiovascular;  Laterality: N/A;   ESOPHAGOGASTRODUODENOSCOPY N/A 07/21/2024   Procedure: EGD (ESOPHAGOGASTRODUODENOSCOPY);  Surgeon: Oscar Elspeth SQUIBB, Moses;  Location: [facility] ENDOSCOPY;  Service: Gastroenterology;  Laterality: N/A;   HOT HEMOSTASIS N/A 07/22/2024   Procedure: COLON, WITH ARGON PLASMA COAGULATION;  Surgeon: Oscar Dover, Moses;  Location: [facility] ENDOSCOPY;  Service: Gastroenterology;  Laterality: N/A;   LEFT HEART CATH AND CORONARY ANGIOGRAPHY N/A 08/11/2024   Procedure: LEFT HEART CATH AND CORONARY ANGIOGRAPHY;  Surgeon: Oscar Oscar Moses;  Location: [facility] INVASIVE CV LAB;  Service: Cardiovascular;  Laterality: N/A;   POLYPECTOMY  07/22/2024   Procedure: POLYPECTOMY, INTESTINE;  Surgeon: Oscar Dover, Moses;  Location: [facility] ENDOSCOPY;  Service:  Gastroenterology;;    Allergies  Allergies[1]  History of Present Illness    Oscar Moses has a PMH of hyperlipidemia, HFpEF, paroxysmal atrial fibrillation on Xarelto , COPD, HTN, coronary artery disease, insulin -dependent diabetes mellitus, CKD stage III, GERD, chronic anemia, and celiac's disease.  He had been previously admitted with unstable angina.  He was noted to have acute diastolic CHF and hypertensive urgency.  His EKG at that time showed normal LVEF, G1 DD, normal RV function, mild MR, and small pericardial effusion.  He received IV diuresis.  Initial plan was for cardiac catheterization during admission but was deferred due to worsening renal function and a staged PCI was planned.  His hospitalization was complicated by acute on chronic anemia.  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  He was also noted to have multiple diverticula and 3 small AVMs.  He was seen in follow-up by Oscar Fabry, PA-C on 08/07/2024.  At that time he reported some shortness of breath and chest pressure.  Cardiac catheterization was planned.  He underwent LHC on 08/11/2024.  He was noted to have proximal-mid LAD stenosis with 30% followed by 50% focal eccentric stenosis of the mid LAD, 20% stenosis of proximal and mid circumflex and 40% stenosis of proximal RCA.  He had successful PCI with PTCA and DES x 1 to his mid LAD.  Repeat RFR was 0.88 and spite of maximal post stent dilation.  He received 190 cc of contrast dye.  He was hydrated postoperatively for 6 hours.  Plan was made for triple therapy aspirin , Plavix , and Xarelto  for 2 weeks.  After that time plan was made for discontinuation of  aspirin .  Recommendation was to continue Plavix  and Xarelto  for 6 months.  His Xarelto  was resumed on 08/12/2024.  He was also given a prescription for Protonix  40 mg daily while on triple therapy.  His simvastatin  was stopped and rosuvastatin  20 mg daily was started.  Plan  for evaluating lipids and LFTs in 2 to 3 months was made.  He presents to the clinic today for follow-up evaluation and states***.  *** denies chest pain, shortness of breath, lower extremity edema, fatigue, palpitations, melena, hematuria, hemoptysis, diaphoresis, weakness, presyncope, syncope, orthopnea, and PND.  Coronary artery disease-no chest pain today.  Underwent LHC with PCI and DES x 1 to his mid LAD on 08/11/2024. Heart healthy low-sodium diet Increase physical activity as tolerated Continue aspirin , amlodipine , metoprolol , rosuvastatin , clopidogrel   Hyperlipidemia-07/19/2024: Cholesterol 98; HDL 38; LDL Cholesterol 31; Triglycerides 147; VLDL 29 High-fiber diet Continue statin therapy, aspirin   Essential hypertension-BP today***. Maintain blood pressure log Continue amlodipine , metoprolol , hydralazine   Paroxysmal atrial fibrillation-EKG today shows***.  Denies episodes of accelerated or irregular heartbeat. Continue amiodarone , Plavix , Xarelto  Avoid triggers caffeine, chocolate, EtOH, dehydration excetra.  Disposition: Follow-up with Oscar Moses, Oscar, or me in 3-4 months.   Home Medications    Prior to Admission medications  Medication Sig Start Date End Date Taking? Authorizing Provider  amiodarone  (PACERONE ) 200 MG tablet Take 1 tablet (200 mg total) by mouth 2 (two) times daily for 10 days, THEN 1 tablet (200 mg total) daily. Follow with cardiology outpatient for refills. Patient taking differently: 1 tablet (200 mg total) daily. Follow with cardiology outpatient for refills. 07/25/24 09/03/24  Oscar Moses  amLODipine  (NORVASC ) 10 MG tablet Take 1 tablet (10 mg total) by mouth daily. Follow with PCP or cards for refills 07/26/24 08/25/24  Oscar Moses  aspirin  EC 81 MG tablet Take 1 tablet (81 mg total) by mouth daily. 08/11/24   Oscar Moses  clopidogrel  (PLAVIX ) 75 MG tablet Take 1 tablet (75 mg total) by mouth daily with  breakfast. 08/12/24   Patwardhan, Manish Moses, Moses  Continuous Glucose Sensor (FREESTYLE LIBRE 3 SENSOR) MISC 1 each by Other route every 14 (fourteen) days. 04/07/24   Provider, Historical, Moses  Cyanocobalamin (VITAMIN B-12 PO) Take 1 tablet by mouth every morning.    Provider, Historical, Moses  fluticasone (FLONASE) 50 MCG/ACT nasal spray Place 1 spray into both nostrils daily as needed for allergies or rhinitis.    Provider, Historical, Moses  furosemide  (LASIX ) 20 MG tablet Take 1 tablet (20 mg total) by mouth daily. Follow with your PCP and/or cardiology for repeat labs within a week. 07/25/24 07/25/25  Oscar Moses  hydrALAZINE  (APRESOLINE ) 100 MG tablet Take 1 tablet (100 mg total) by mouth every 8 (eight) hours. Follow with PCP or cards for refills 07/25/24 08/24/24  Oscar Moses  HYDROcodone -acetaminophen  (NORCO) 10-325 MG tablet Take 1 tablet by mouth 4 (four) times daily as needed.    Provider, Historical, Moses  insulin  lispro (HUMALOG) 100 UNIT/ML KwikPen See admin instructions. Use per sliding scale three to four times daily as directed. 11/13/19   Provider, Historical, Moses  Melatonin 10 MG TABS Take 20 mg by mouth at bedtime.    Provider, Historical, Moses  metoprolol  succinate (TOPROL -XL) 50 MG 24 hr tablet Take 1 tablet (50 mg total) by mouth daily. Take with or immediately following a meal. 07/26/24 08/25/24  Oscar Moses  Multiple Vitamin (MULTIVITAMIN ADULT PO)  Take 1 tablet by mouth every morning.    Provider, Historical, Moses  nitroGLYCERIN  (NITROSTAT ) 0.4 MG SL tablet Place 1 tablet (0.4 mg total) under the tongue every 5 (five) minutes as needed. 08/11/24 08/11/25  Oscar Moses  pantoprazole  (PROTONIX ) 40 MG tablet Take 1 tablet (40 mg total) by mouth daily. 08/11/24 09/10/24  Goodrich, Callie E, PA-C  potassium chloride  (KLOR-CON  M) 10 MEQ tablet Take 1 tablet (10 mEq total) by mouth daily. Follow with PCP or cards for repeat labs within a week 07/25/24  08/24/24  Oscar Moses  PREDNISOLONE ACETATE OP Place 1 drop into both eyes daily.    Provider, Historical, Moses  Rivaroxaban  (XARELTO ) 15 MG TABS tablet Take 1 tablet (15 mg total) by mouth daily with supper. Follow with cardiology or PCP for refills. 07/25/24   Oscar Moses  rosuvastatin  (CRESTOR ) 20 MG tablet Take 1 tablet (20 mg total) by mouth daily. 08/11/24 08/11/25  Oscar Moses  traZODone  (DESYREL ) 50 MG tablet Take 75 mg by mouth at bedtime as needed. 02/11/24   Provider, Historical, Moses  TRESIBA FLEXTOUCH 100 UNIT/ML FlexTouch Pen Inject 12 Units into the skin at bedtime. 11/13/19   Provider, Historical, Moses    Family History    No family history on file. He indicated that his mother is deceased. He indicated that his father is deceased.  Social History    Social History   Socioeconomic History   Marital status: Married    Spouse name: Not on file   Number of children: Not on file   Years of education: Not on file   Highest education level: Not on file  Occupational History   Occupation: retired  Tobacco Use   Smoking status: Former    Types: Cigarettes   Smokeless tobacco: Never  Vaping Use   Vaping status: Never Used  Substance and Sexual Activity   Alcohol use: Not Currently   Drug use: Never   Sexual activity: Not on file  Other Topics Concern   Not on file  Social History Narrative   Not on file   Social Drivers of Health   Tobacco Use: Medium Risk (08/17/2024)   Patient History    Smoking Tobacco Use: Former    Smokeless Tobacco Use: Never    Passive Exposure: Not on Actuary Strain: Not on file  Food Insecurity: No Food Insecurity (07/18/2024)   Epic    Worried About Programme Researcher, Broadcasting/film/video in the Last Year: Never true    Ran Out of Food in the Last Year: Never true  Transportation Needs: No Transportation Needs (07/18/2024)   Epic    Lack of Transportation (Medical): No    Lack of Transportation  (Non-Medical): No  Physical Activity: Not on file  Stress: Not on file  Social Connections: Moderately Isolated (07/18/2024)   Social Connection and Isolation Panel    Frequency of Communication with Friends and Family: More than three times a week    Frequency of Social Gatherings with Friends and Family: More than three times a week    Attends Religious Services: Never    Database Administrator or Organizations: No    Attends Banker Meetings: Never    Marital Status: Married  Catering Manager Violence: Not At Risk (07/18/2024)   Epic    Fear of Current or Ex-Partner: No    Emotionally Abused: No    Physically Abused: No  Sexually Abused: No  Depression (PHQ2-9): Not on file  Alcohol Screen: Not on file  Housing: Unknown (07/18/2024)   Epic    Unable to Pay for Housing in the Last Year: No    Number of Times Moved in the Last Year: Not on file    Homeless in the Last Year: Not on file  Utilities: Not At Risk (07/18/2024)   Epic    Threatened with loss of utilities: No  Health Literacy: Not on file     Review of Systems    General:  No chills, fever, night sweats or weight changes.  Cardiovascular:  No chest pain, dyspnea on exertion, edema, orthopnea, palpitations, paroxysmal nocturnal dyspnea. Dermatological: No rash, lesions/masses Respiratory: No cough, dyspnea Urologic: No hematuria, dysuria Abdominal:   No nausea, vomiting, diarrhea, bright red blood per rectum, melena, or hematemesis Neurologic:  No visual changes, wkns, changes in mental status. All other systems reviewed and are otherwise negative except as noted above.  Physical Exam    VS:  There were no vitals taken for this visit. , BMI There is no height or weight on file to calculate BMI. GEN: Well nourished, well developed, in no acute distress. HEENT: normal. Neck: Supple, no JVD, carotid bruits, or masses. Cardiac: RRR, no murmurs, rubs, or gallops. No clubbing, cyanosis, edema.   Radials/DP/PT 2+ and equal bilaterally.  Respiratory:  Respirations regular and unlabored, clear to auscultation bilaterally. GI: Soft, nontender, nondistended, BS + x 4. MS: no deformity or atrophy. Skin: warm and dry, no rash. Neuro:  Strength and sensation are intact. Psych: Normal affect.  Accessory Clinical Findings    Recent Labs: 07/17/2024: Pro Brain Natriuretic Peptide 2,857.0 07/23/2024: B Natriuretic Peptide 342.9 07/25/2024: ALT 20; Magnesium  1.8; TSH 5.323 08/07/2024: BUN 31; Creatinine, Ser 1.55; Hemoglobin 9.7; Platelets 313; Potassium 5.2; Sodium 139   Recent Lipid Panel    Component Value Date/Time   CHOL 98 07/19/2024 0420   TRIG 147 07/19/2024 0420   HDL 38 (L) 07/19/2024 0420   CHOLHDL 2.6 07/19/2024 0420   VLDL 29 07/19/2024 0420   LDLCALC 31 07/19/2024 0420    No BP recorded.  {Refresh Note OR Click here to enter BP  :1}***    ECG personally reviewed by me today- ***    Echocardiogram 07/19/2024 IMPRESSIONS     1. Left ventricular ejection fraction, by estimation, is 55 to 60%. The  left ventricle has normal function. The left ventricle has no regional  wall motion abnormalities. Left ventricular diastolic parameters are  consistent with Grade I diastolic  dysfunction (impaired relaxation).   2. Right ventricular systolic function is normal. The right ventricular  size is normal.   3. Left atrial size was moderately dilated.   4. Right atrial size was mildly dilated.   5. A small pericardial effusion is present. There is no evidence of  cardiac tamponade.   6. The mitral valve is normal in structure. Mild mitral valve  regurgitation. No evidence of mitral stenosis.   7. The aortic valve is tricuspid. Aortic valve regurgitation is not  visualized. No aortic stenosis is present.   8. The inferior vena cava is dilated in size with <50% respiratory  variability, suggesting right atrial pressure of 15 mmHg.   FINDINGS   Left Ventricle: Left  ventricular ejection fraction, by estimation, is 55  to 60%. The left ventricle has normal function. The left ventricle has no  regional wall motion abnormalities. The left ventricular internal cavity  size was  normal in size. There is   no left ventricular hypertrophy. Left ventricular diastolic parameters  are consistent with Grade I diastolic dysfunction (impaired relaxation).   Right Ventricle: The right ventricular size is normal. No increase in  right ventricular wall thickness. Right ventricular systolic function is  normal.   Left Atrium: Left atrial size was moderately dilated.   Right Atrium: Right atrial size was mildly dilated.   Pericardium: A small pericardial effusion is present. There is no evidence  of cardiac tamponade.   Mitral Valve: The mitral valve is normal in structure. Mild mitral valve  regurgitation. No evidence of mitral valve stenosis.   Tricuspid Valve: The tricuspid valve is normal in structure. Tricuspid  valve regurgitation is not demonstrated. No evidence of tricuspid  stenosis.   Aortic Valve: The aortic valve is tricuspid. Aortic valve regurgitation is  not visualized. No aortic stenosis is present.   Pulmonic Valve: The pulmonic valve was normal in structure. Pulmonic valve  regurgitation is not visualized. No evidence of pulmonic stenosis.   Aorta: The aortic root is normal in size and structure.   Venous: The inferior vena cava is dilated in size with less than 50%  respiratory variability, suggesting right atrial pressure of 15 mmHg.   IAS/Shunts: No atrial level shunt detected by color flow Doppler.   Cardiac catheterization 08/11/2024    Recommend triple therapy with aspirin , Plavix , Xarelto  for 2 weeks. After that, stop aspirin , continue Plavix  and Xarelto  for at least 6 months. After that, if no recurrent anginal symptoms, could consider stopping Plavix  and continuing Xarelto .   Coronary angiography 08/11/2024: LM: Normal LAD:  Prox-mid 30% disease, mid focal eccentric 50% stenosis          RFR 0.85 in mid LAD, with step up across the lesion in mid LAD after large diag Lcx: Prox20%, mid 20% disease RCA: Prox 40% stenosis, RFR 0.92   LVEDP 19 mmHg   Successful percutaneous coronary intervention mid LAD        PTCA and stent placement 3.5 X 20 mm Synergy drug-eluting stent        Postdilatation using 3.5 x 12 mm, 3.75 x 12 mm, and 3.75 x 8 mm  balloons up to 24 atm      Minimal waist remained in the stent.  Pre-PCI RFR of 0.85 in mid LAD, improved to 0.88 immediately distal to the stent post PCI.  This was in spite of maximal post stent dilatation.  I expect slow improvement in symptoms, but may not completely resolved.  In future, may need to consider OPN balloon postdilatation and/or lithotripsy or slightly underexpanded stent.   Contrast used: 190 cc Recommend IV hydration with normal saline 125 cc for 6 hours   Manish JINNY Lawrence, Moses   Assessment & Plan   1.  ***   Josefa HERO. Edwin Cherian NP-C     08/22/2024, 8:43 AM [facility] [address] [place], [place] [address] Office [phone]    Notice: This dictation was prepared with Dragon dictation along with smaller phrase technology. Any transcriptional errors that result from this process are unintentional and may not be corrected upon review.   I spent***minutes examining this patient, reviewing medications, and using patient centered shared decision making involving their cardiac care.   I spent  20 minutes reviewing past medical history,  medications, and prior cardiac tests.     [1] No Known Allergies

## 2024-08-23 ENCOUNTER — Other Ambulatory Visit (HOSPITAL_COMMUNITY): Payer: Self-pay

## 2024-08-25 ENCOUNTER — Ambulatory Visit: Attending: General Practice | Admitting: General Practice

## 2024-08-25 ENCOUNTER — Encounter: Payer: Self-pay | Admitting: General Practice

## 2024-08-25 ENCOUNTER — Other Ambulatory Visit: Payer: Self-pay

## 2024-08-25 VITALS — BP 132/60 | HR 61 | Ht 70.0 in | Wt 168.0 lb

## 2024-08-25 DIAGNOSIS — E785 Hyperlipidemia, unspecified: Secondary | ICD-10-CM

## 2024-08-25 DIAGNOSIS — Z955 Presence of coronary angioplasty implant and graft: Secondary | ICD-10-CM

## 2024-08-25 DIAGNOSIS — I10 Essential (primary) hypertension: Secondary | ICD-10-CM

## 2024-08-25 DIAGNOSIS — I480 Paroxysmal atrial fibrillation: Secondary | ICD-10-CM | POA: Diagnosis not present

## 2024-08-25 MED ORDER — CLOPIDOGREL BISULFATE 75 MG PO TABS: 75.0000 mg | ORAL_TABLET | Freq: Every day | ORAL | 3 refills | Status: AC

## 2024-08-25 MED ORDER — FUROSEMIDE 20 MG PO TABS: 20.0000 mg | ORAL_TABLET | Freq: Every day | ORAL | 3 refills | Status: AC

## 2024-08-25 MED ORDER — PANTOPRAZOLE SODIUM 40 MG PO TBEC: 40.0000 mg | DELAYED_RELEASE_TABLET | Freq: Every day | ORAL | 3 refills | Status: AC

## 2024-08-25 MED ORDER — AMLODIPINE BESYLATE 5 MG PO TABS: 5.0000 mg | ORAL_TABLET | Freq: Every day | ORAL | 1 refills | Status: AC

## 2024-08-25 MED ORDER — RIVAROXABAN 15 MG PO TABS: 15.0000 mg | ORAL_TABLET | Freq: Every day | ORAL | 3 refills | Status: AC

## 2024-08-25 MED ORDER — LOSARTAN POTASSIUM 25 MG PO TABS: 25.0000 mg | ORAL_TABLET | Freq: Every day | ORAL | 1 refills | Status: AC

## 2024-08-25 MED ORDER — AMIODARONE HCL 200 MG PO TABS: 200.0000 mg | ORAL_TABLET | Freq: Every day | ORAL | 3 refills | Status: AC

## 2024-08-25 MED ORDER — METOPROLOL SUCCINATE ER 50 MG PO TB24: 50.0000 mg | ORAL_TABLET | Freq: Every day | ORAL | 3 refills | Status: AC

## 2024-08-25 MED ORDER — ROSUVASTATIN CALCIUM 20 MG PO TABS: 20.0000 mg | ORAL_TABLET | Freq: Every day | ORAL | 3 refills | Status: AC

## 2024-08-25 MED ORDER — HYDRALAZINE HCL 100 MG PO TABS: 100.0000 mg | ORAL_TABLET | Freq: Three times a day (TID) | ORAL | 2 refills | Status: AC

## 2024-08-25 MED ORDER — NITROGLYCERIN 0.4 MG SL SUBL: 0.4000 mg | SUBLINGUAL_TABLET | SUBLINGUAL | 3 refills | Status: AC | PRN

## 2024-08-25 NOTE — Patient Instructions (Addendum)
 Medication Instructions:  STOP Aspirin  81 mg  DECREASE Amlodipine  5 mg daily  START Losartan  25 mg daily   *If you need a refill on your cardiac medications before your next appointment, please call your pharmacy*  Lab Work: BMET Today and BMET in a week   If you have labs (blood work) drawn today and your tests are completely normal, you will receive your results only by: MyChart Message (if you have MyChart) OR A paper copy in the mail If you have any lab test that is abnormal or we need to change your treatment, we will call you to review the results.  Testing/Procedures: NONE ORDERED  Follow-Up: At [facility], you and your health needs are our priority.  As part of our continuing mission to provide you with exceptional heart care, our providers are all part of one team.  This team includes your primary Cardiologist (physician) and Advanced Practice Providers or APPs (Physician Assistants and Nurse Practitioners) who all work together to provide you with the care you need, when you need it.  Your next appointment:   3-4 month(s)  Provider:   Lonni Cash, MD , Orren Fabry, PA or Karel Beauvais, NP   We recommend signing up for the patient portal called MyChart.  Sign up information is provided on this After Visit Summary.  MyChart is used to connect with patients for Virtual Visits (Telemedicine).  Patients are able to view lab/test results, encounter notes, upcoming appointments, etc.  Non-urgent messages can be sent to your provider as well.   To learn more about what you can do with MyChart, go to [email or web address].   Other Instructions: Exercise regularly as told by your doctor. Make sure to weight daily and keep a weight log.   Moderate-intensity exercise is any activity that gets you moving enough to burn at least three times more energy (calories) than if you were sitting. Examples of moderate exercise include: Walking a mile in 15  minutes. Doing light yard work. Biking at an easy pace. Most people should get at least 150 minutes of moderate-intensity exercise a week to maintain their body weight.  Increase your water  intake: Maintain hydration

## 2024-08-26 LAB — BASIC METABOLIC PANEL WITH GFR
BUN/Creatinine Ratio: 13 (ref 10–24)
BUN: 20 mg/dL (ref 8–27)
CO2: 22 mmol/L (ref 20–29)
Calcium: 8.7 mg/dL (ref 8.6–10.2)
Chloride: 103 mmol/L (ref 96–106)
Creatinine, Ser: 1.5 mg/dL — ABNORMAL HIGH (ref 0.76–1.27)
Glucose: 142 mg/dL — ABNORMAL HIGH (ref 70–99)
Potassium: 4.9 mmol/L (ref 3.5–5.2)
Sodium: 136 mmol/L (ref 134–144)
eGFR: 49 mL/min/1.73 — ABNORMAL LOW

## 2024-08-27 ENCOUNTER — Ambulatory Visit: Payer: Self-pay | Admitting: General Practice

## 2024-08-28 NOTE — Progress Notes (Signed)
 Spoke with patient regarding lab results.he understands to monitor sugar intake and increase his water  water  intake. Faxed results to PCP office with results notes attached.

## 2024-08-28 NOTE — Telephone Encounter (Signed)
 Called the patient and asked him to call back to go over his lab results. First attempt.

## 2024-11-30 ENCOUNTER — Ambulatory Visit: Admitting: General Practice
